# Patient Record
Sex: Female | Born: 1990 | Race: White | Hispanic: No | Marital: Single | Smoking: Current every day smoker
Health system: Southern US, Community
[De-identification: ages and names within clinical notes are randomized; demographics above are authoritative.]

## PROBLEM LIST (undated history)

## (undated) DIAGNOSIS — F329 Major depressive disorder, single episode, unspecified: Secondary | ICD-10-CM

## (undated) DIAGNOSIS — A749 Chlamydial infection, unspecified: Secondary | ICD-10-CM

## (undated) DIAGNOSIS — I639 Cerebral infarction, unspecified: Secondary | ICD-10-CM

## (undated) DIAGNOSIS — G43909 Migraine, unspecified, not intractable, without status migrainosus: Secondary | ICD-10-CM

## (undated) DIAGNOSIS — Z8619 Personal history of other infectious and parasitic diseases: Secondary | ICD-10-CM

## (undated) DIAGNOSIS — B1920 Unspecified viral hepatitis C without hepatic coma: Secondary | ICD-10-CM

## (undated) DIAGNOSIS — M1990 Unspecified osteoarthritis, unspecified site: Secondary | ICD-10-CM

## (undated) DIAGNOSIS — J45909 Unspecified asthma, uncomplicated: Secondary | ICD-10-CM

## (undated) DIAGNOSIS — R569 Unspecified convulsions: Secondary | ICD-10-CM

## (undated) DIAGNOSIS — F32A Depression, unspecified: Secondary | ICD-10-CM

## (undated) DIAGNOSIS — F419 Anxiety disorder, unspecified: Secondary | ICD-10-CM

## (undated) DIAGNOSIS — F1920 Other psychoactive substance dependence, uncomplicated: Secondary | ICD-10-CM

## (undated) DIAGNOSIS — E119 Type 2 diabetes mellitus without complications: Secondary | ICD-10-CM

## (undated) DIAGNOSIS — K219 Gastro-esophageal reflux disease without esophagitis: Secondary | ICD-10-CM

## (undated) HISTORY — DX: Migraine, unspecified, not intractable, without status migrainosus: G43.909

## (undated) HISTORY — PX: NOSE SURGERY: SHX723

## (undated) HISTORY — DX: Type 2 diabetes mellitus without complications: E11.9

## (undated) HISTORY — DX: Personal history of other infectious and parasitic diseases: Z86.19

## (undated) HISTORY — PX: NO PAST SURGERIES: SHX2092

## (undated) HISTORY — DX: Gastro-esophageal reflux disease without esophagitis: K21.9

## (undated) HISTORY — DX: Unspecified osteoarthritis, unspecified site: M19.90

## (undated) HISTORY — DX: Unspecified viral hepatitis C without hepatic coma: B19.20

## (undated) HISTORY — DX: Depression, unspecified: F32.A

## (undated) HISTORY — DX: Anxiety disorder, unspecified: F41.9

## (undated) HISTORY — DX: Unspecified convulsions: R56.9

## (undated) HISTORY — DX: Unspecified asthma, uncomplicated: J45.909

## (undated) HISTORY — DX: Cerebral infarction, unspecified: I63.9

## (undated) HISTORY — DX: Other psychoactive substance dependence, uncomplicated: F19.20

---
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
## 2010-01-23 ENCOUNTER — Encounter: Payer: Self-pay | Admitting: Family Medicine

## 2010-05-17 ENCOUNTER — Emergency Department (HOSPITAL_COMMUNITY)
Admission: EM | Admit: 2010-05-17 | Discharge: 2010-05-17 | Payer: Self-pay | Source: Home / Self Care | Admitting: Emergency Medicine

## 2010-06-27 NOTE — Consult Note (Signed)
Summary: [facility] Pt Discharge Instructions  [facility] Pt Discharge Instructions   Imported By: Clydell Hakim 09/06/2009 10:01:38  _____________________________________________________________________  External Attachment:    Type:   Image     Comment:   External Document

## 2010-06-27 NOTE — Miscellaneous (Signed)
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

## 2010-06-27 NOTE — Progress Notes (Signed)
Summary: Test Results   Phone Note Outgoing Call   Call placed by: Garen Grams LPN,  June 07, 2009 10:21 AM Summary of Call: Left message for patient to call back regarding test results. Initial call taken by: Garen Grams LPN,  June 07, 2009 10:21 AM  Follow-up for Phone Call        Spoke with patient and informed her of (+) chlamydia result. States she will be in today for treatment. Follow-up by: Garen Grams LPN,  June 07, 2009 10:28 AM

## 2010-06-27 NOTE — Assessment & Plan Note (Signed)
Summary: std?,df   Vital Signs:  Patient profile:   20 year old female Weight:      107 pounds Temp:     98.2 degrees F Pulse rate:   69 / minute BP sitting:   106 / 68  Vitals Entered By: Jone Baseman CMA (June 06, 2009 3:22 PM) CC: upreg and std screen Is Patient Diabetic? No Pain Assessment Patient in pain? no        Primary Care Provider:  Alanda Amass MD  CC:  upreg and std screen.  History of Present Illness: 1. STD and upreg Has had unprotected sex with BF. 2 days late on period (which are very regular). Has had some vaginal discharge and occasional sharp pain in her "uterus." BF volunteers that he has had some pain at the head of his penis but no d/c.  2. contraception Not on birth control. Tried depot in the past and didn't like it. Can't remember to take OCPs. Is a smoker ( ~1/3 PPD). Would like to try implanon.   ROS: no fevers, chills, flu-like symptoms.    Habits & Providers  Alcohol-Tobacco-Diet     Tobacco Status: current     Tobacco Counseling: to quit use of tobacco products     Cigarette Packs/Day: 0.25  Current Medications (verified): 1)  None  Allergies: No Known Drug Allergies  Social History: No history of sexual abuse per patient. High risk sexual behavior. 7 partners in only 2.5 years. Now with 61 yo boyfriend x 1.5 years. Mom knows this per patient. Lives with mom, mom's boyfriend, 43 year old ___.  Smokes about one pack of cigarretes every 2 weeks. Mom also smokes. Has used marijuana in the past denies cocaine. Packs/Day:  0.25  Physical Exam  General:  Well-developed,well-nourished,in no acute distress; alert,appropriate and cooperative throughout examination. VSS  Lungs:  work of breathing unlabored, clear to auscultation bilaterally; no wheezes, rales, or ronchi; good air movement throughout  Heart:  regular rate and rhythm, no murmurs; normal s1/s2  Abdomen:  +BS, soft, non-tender, non-distended; no masses; no rebound or  guarding  Genitalia:  Pelvic Exam:        External: normal female genitalia without lesions or masses        Vagina: normal without lesions or masses        Cervix: normal without lesions or masses, mild white discharge at the os        Adnexa: normal bimanual exam without masses or fullness        Uterus: normal by palpation        Pap smear: not performed Extremities:  no cyanosis, clubbing, or edema  Skin:  warm, good turgor; no rashes or lesions.   Impression & Recommendations:  Problem # 1:  SEXUALLY TRANSMITTED DISEASE, EXPOSURE TO (ICD-V01.6) Assessment Unchanged discussed safe sex. Advised condoms. STD labs today. Upreg negative. wet prep shows BV - flagyl for one week. Orders: GC/Chlamydia-[facility] ([id number]/[id number]) HIV-[facility] ([id number]) RPR-[facility] [phone]) Wet Prep- [facility] ([id number]) [facility]- Est Level  3 (13086)  Problem # 2:  CONTRACEPTIVE MANAGEMENT (ICD-V25.09) Assessment: Unchanged would like implanon - will refer for this procedure.  Orders: [facility]- Est Level  3 (99213) Misc. Referral (Misc. Ref)  Complete Medication List: 1)  Metronidazole 500 Mg Tabs (Metronidazole) .... One tab by mouth two times a day for 7 days.  Other Orders: U Preg-[facility] ([id number])  Patient Instructions: 1)  always use condoms with sex. 2)  we'll try to get you set up for  the implanon -- it's safer not to smoke if you are using hormonal birth control (increased risk of blood clots with smoking). 3)  follow-up with Dr. Wallene Huh in 3-4 months Prescriptions: METRONIDAZOLE 500 MG TABS (METRONIDAZOLE) one tab by mouth two times a day for 7 days.  #14 x 0   Entered and Authorized by:   Myrtie Soman  MD   Signed by:   Myrtie Soman  MD on 06/06/2009   Method used:   Electronically to        [facility]  [address] [phone]* (retail)       [address]       [place], [place]  [address]       Ph: [phone]       Fax: [phone]   RxID:   [id number]   Laboratory Results   Urine  Tests  Date/Time Received: June 06, 2009 3:22 PM  Date/Time Reported: June 06, 2009 3:26 PM     Urine HCG: negative Comments: ...........test performed by...........Marland KitchenTerese Door, CMA  Date/Time Received: June 06, 2009 3:50 PM  Date/Time Reported: June 06, 2009 4:02 PM   Wet [place] Source: vag WBC/hpf: >20 Bacteria/hpf: 3+  Cocci Clue cells/hpf: many  Positive whiff Yeast/hpf: none Trichomonas/hpf: none Comments: ...............test performed by......Marland KitchenBonnie A. [place], MLS (ASCP)cm

## 2010-06-27 NOTE — Assessment & Plan Note (Signed)
Summary: std treatment/ab   Nurse Visit   Allergies: No Known Drug Allergies  Medication Administration  Injection # 1:    Diagnosis: CHLAMYDIAL INFECTION (ICD-099.41)  Medication # 1:    Medication: Azithromycin oral    Diagnosis: CHLAMYDIAL INFECTION (ICD-099.41)    Dose: 1gram    Route: po    Exp Date: 04/25/2010    Lot #: [id number]    Mfr: greenstone    Comments: advised to abstain from sexual contact for 7 days    Patient tolerated medication without complications    Given by: Gladstone Pih (June 07, 2009 4:09 PM)  Orders Added: 1)  Azithromycin oral [Q0144] 2)  Est Level 1- [facility] [11914]
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
## 2010-08-05 LAB — URINALYSIS, ROUTINE W REFLEX MICROSCOPIC
Glucose, UA: NEGATIVE mg/dL
Hgb urine dipstick: NEGATIVE
Nitrite: NEGATIVE
pH: 5.5 (ref 5.0–8.0)

## 2010-08-05 LAB — PREGNANCY, URINE: Preg Test, Ur: NEGATIVE

## 2010-11-06 ENCOUNTER — Inpatient Hospital Stay (HOSPITAL_COMMUNITY)
Admission: AD | Admit: 2010-11-06 | Discharge: 2010-11-06 | Disposition: A | Discharge status: Home / Self Care | Payer: Self-pay | Source: Ambulatory Visit | Attending: Obstetrics & Gynecology | Admitting: Obstetrics & Gynecology

## 2010-11-06 DIAGNOSIS — N390 Urinary tract infection, site not specified: Secondary | ICD-10-CM

## 2010-11-06 DIAGNOSIS — R300 Dysuria: Secondary | ICD-10-CM | POA: Insufficient documentation

## 2010-11-06 LAB — POCT PREGNANCY, URINE: Preg Test, Ur: NEGATIVE

## 2010-11-06 LAB — URINALYSIS, ROUTINE W REFLEX MICROSCOPIC
Nitrite: NEGATIVE
Protein, ur: 100 mg/dL — AB
Specific Gravity, Urine: >1.03 — ABNORMAL HIGH (ref 1.005–1.030)

## 2010-11-06 LAB — URINE MICROSCOPIC-ADD ON

## 2011-02-20 ENCOUNTER — Encounter: Payer: Self-pay | Admitting: Family Medicine

## 2011-02-20 ENCOUNTER — Ambulatory Visit (INDEPENDENT_AMBULATORY_CARE_PROVIDER_SITE_OTHER): Payer: Medicaid Other | Admitting: Family Medicine

## 2011-02-20 DIAGNOSIS — J45909 Unspecified asthma, uncomplicated: Secondary | ICD-10-CM

## 2011-02-20 DIAGNOSIS — C321 Malignant neoplasm of supraglottis: Secondary | ICD-10-CM

## 2011-02-20 DIAGNOSIS — Z00129 Encounter for routine child health examination without abnormal findings: Secondary | ICD-10-CM

## 2011-02-20 DIAGNOSIS — Z0000 Encounter for general adult medical examination without abnormal findings: Secondary | ICD-10-CM

## 2011-02-20 DIAGNOSIS — N760 Acute vaginitis: Secondary | ICD-10-CM

## 2011-02-20 DIAGNOSIS — F17200 Nicotine dependence, unspecified, uncomplicated: Secondary | ICD-10-CM

## 2011-02-20 LAB — POCT WET PREP (WET MOUNT): WBC, Wet Prep HPF POC: >20

## 2011-02-20 MED ORDER — METRONIDAZOLE 500 MG PO TABS: 500.0000 mg | ORAL_TABLET | Freq: Three times a day (TID) | ORAL | Status: AC

## 2011-02-20 MED ORDER — EPINEPHRINE 0.3 MG/0.3ML IJ DEVI: 0.3000 mg | Freq: Once | INTRAMUSCULAR | Status: DC

## 2011-02-20 NOTE — Patient Instructions (Addendum)
It was good to meet you.  Congratulations on quitting smoking- this is one of the healthiest things you can do for yourself!    I will send you a letter if your lab results are normal or call you if they are abnormal.    For your hemorrhoids- try preparation H cream, and you can try Miralax powder for your constipation.    Please let me know if at any time you are interested in any other kind of birth control.

## 2011-02-21 LAB — GC/CHLAMYDIA PROBE AMP, GENITAL: GC Probe Amp, Genital: NEGATIVE

## 2011-02-21 NOTE — Progress Notes (Signed)
  Subjective:    Patient ID: Haley Olsen, female    DOB: Jun 14, 1990, 20 y.o.   MRN: [id number]  HPI  Patient presents for annual check up.  She has no complaints except that her wisdom teeth are impacted and she needs to have them taken out.  She says she does not have the money right now so she is trying to save up.  She also asks about her uvula- say she thinks it looks strange.    Pt is currently sexually active and was recently treated for both gonorrhea and trichomonas.  She says she uses condoms for birth control.  She does not like any kind of hormonal contraceptive because she says the hormones make her crazy.    Pt says she has an albuterol inhaler and has to use it sometimes.  She also recently quit smoking and is confident she can stay quit. She says she has an allergy to Wasp stings and is wondering how to get an epi pen.   Review of Systems Negative except HPI.     Objective:   Physical Exam BP 104/66  Pulse 60  Temp(Src) 98.2 F (36.8 C) (Oral)  Wt 112 lb 4.8 oz (50.939 kg)  LMP 01/12/2011 General appearance: alert, cooperative and no distress Eyes: conjunctivae/corneas clear. PERRL, EOM's intact. Fundi benign. Ears: normal TM's and external ear canals both ears Nose: Nares normal. Septum midline. Mucosa normal. No drainage or sinus tenderness. Throat: lips, mucosa, and tongue normal; teeth and gums normal and Patient does have long thin uvula but no erythema or lesions. Normal variant. Neck: no adenopathy, supple, symmetrical, trachea midline and thyroid not enlarged, symmetric, no tenderness/mass/nodules Lungs: clear to auscultation bilaterally Heart: regular rate and rhythm, S1, S2 normal, no murmur, click, rub or gallop Abdomen: soft, non-tender; bowel sounds normal; no masses,  no organomegaly Pelvic: cervix normal in appearance, external genitalia normal, no adnexal masses or tenderness, no cervical motion tenderness, rectovaginal septum normal, uterus normal  size, shape, and consistency and + grey, odorous discharge  Extremities: extremities normal, atraumatic, no cyanosis or edema      Assessment & Plan:  20 year old female, well woman exam - STD screening done, wet prep + for BV, Rx flagyl today.   - Rx for epi pen for wasp allergy ASTHMA, INTERMITTENT Well controlled, continue albuterol prn.   TOBACCO USER Patient has quit.  Congratulated and encouraged her.

## 2011-02-21 NOTE — Assessment & Plan Note (Signed)
Patient has quit.  Congratulated and encouraged her.

## 2011-02-21 NOTE — Assessment & Plan Note (Signed)
Well-controlled, continue albuterol prn

## 2011-02-24 LAB — URINE MICROSCOPIC-ADD ON

## 2011-02-24 LAB — URINALYSIS, ROUTINE W REFLEX MICROSCOPIC
Bilirubin Urine: NEGATIVE
Ketones, ur: NEGATIVE
Leukocytes, UA: NEGATIVE
Urobilinogen, UA: 0.2
pH: 6

## 2011-02-24 LAB — WET PREP, GENITAL: Yeast Wet Prep HPF POC: NONE SEEN

## 2011-02-24 LAB — CBC
MCHC: 33.4
MCV: 96.3
Platelets: 260
RBC: 4.43
RDW: 12.5

## 2011-02-24 LAB — GC/CHLAMYDIA PROBE AMP, GENITAL: Chlamydia, DNA Probe: POSITIVE — AB

## 2011-02-25 ENCOUNTER — Encounter: Payer: Self-pay | Admitting: Family Medicine

## 2011-03-06 ENCOUNTER — Emergency Department (HOSPITAL_COMMUNITY)
Admission: EM | Admit: 2011-03-06 | Discharge: 2011-03-06 | Disposition: A | Discharge status: Home / Self Care | Payer: Medicaid Other | Attending: Emergency Medicine | Admitting: Emergency Medicine

## 2011-03-06 ENCOUNTER — Emergency Department (HOSPITAL_COMMUNITY): Payer: Medicaid Other

## 2011-03-06 DIAGNOSIS — M25539 Pain in unspecified wrist: Secondary | ICD-10-CM | POA: Insufficient documentation

## 2011-03-06 DIAGNOSIS — J45909 Unspecified asthma, uncomplicated: Secondary | ICD-10-CM | POA: Insufficient documentation

## 2011-03-06 DIAGNOSIS — W19XXXA Unspecified fall, initial encounter: Secondary | ICD-10-CM | POA: Insufficient documentation

## 2011-03-06 DIAGNOSIS — Y990 Civilian activity done for income or pay: Secondary | ICD-10-CM | POA: Insufficient documentation

## 2011-03-06 DIAGNOSIS — S62109A Fracture of unspecified carpal bone, unspecified wrist, initial encounter for closed fracture: Secondary | ICD-10-CM | POA: Insufficient documentation

## 2011-04-25 ENCOUNTER — Emergency Department (HOSPITAL_COMMUNITY)
Admission: EM | Admit: 2011-04-25 | Discharge: 2011-04-25 | Discharge status: Against Medical Advice | Payer: Medicaid Other | Attending: Emergency Medicine | Admitting: Emergency Medicine

## 2011-04-25 ENCOUNTER — Encounter (HOSPITAL_COMMUNITY): Payer: Self-pay | Admitting: Emergency Medicine

## 2011-04-25 DIAGNOSIS — R079 Chest pain, unspecified: Secondary | ICD-10-CM | POA: Insufficient documentation

## 2011-04-25 DIAGNOSIS — R141 Gas pain: Secondary | ICD-10-CM | POA: Insufficient documentation

## 2011-04-25 DIAGNOSIS — R109 Unspecified abdominal pain: Secondary | ICD-10-CM | POA: Insufficient documentation

## 2011-04-25 DIAGNOSIS — J45909 Unspecified asthma, uncomplicated: Secondary | ICD-10-CM | POA: Insufficient documentation

## 2011-04-25 DIAGNOSIS — R091 Pleurisy: Secondary | ICD-10-CM | POA: Insufficient documentation

## 2011-04-25 DIAGNOSIS — R142 Eructation: Secondary | ICD-10-CM | POA: Insufficient documentation

## 2011-04-25 MED ORDER — GI COCKTAIL ~~LOC~~: 30.0000 mL | Freq: Once | ORAL | Status: DC

## 2011-04-25 MED ORDER — KETOROLAC TROMETHAMINE 60 MG/2ML IM SOLN: 30.0000 mg | Freq: Once | INTRAMUSCULAR | Status: DC

## 2011-04-25 MED ORDER — DIAZEPAM 5 MG PO TABS: 5.0000 mg | ORAL_TABLET | Freq: Once | ORAL | Status: DC

## 2011-04-25 NOTE — ED Notes (Signed)
Pt c/o right sided rib pain starting last night; pt sts pain worse with deep breath and can not take deep breath; pt denies injury or other illness

## 2011-04-25 NOTE — ED Provider Notes (Signed)
History     CSN: [id number] Arrival date & time: 04/25/2011 12:23 PM   First MD Initiated Contact with Patient 04/25/11 1257      Chief Complaint  Patient presents with  . Flank Pain    rib pain    (Consider location/radiation/quality/duration/timing/severity/associated sxs/prior treatment) HPI Comments: Patient reports that she's had a sharp pain in the right flank rib cage region which is worse with taking a deep breath. She has a history of asthma but denies any wheezing or chest tightness. She denies any injury. She denies any skin rash, fever or chills. She reports that she had some bloating and gas pains related to eating Thanksgiving dinner but that seems to resolve. She reports that she did have some mild pain in the same location earlier yesterday but it resolved. And then the pain began again last night and woke her from sleep it has been present since last night. She reports that she cannot take a deep breath because the pain is so severe. She reports that she has a bowel movement which did not improve her symptoms. She denies any nausea or vomiting. She denies any lower extremity swelling or pain and denies any recent travel.  Patient is a 20 y.o. female presenting with flank pain. The history is provided by the patient.  Flank Pain Associated symptoms include chest pain. Pertinent negatives include no shortness of breath.    Past Medical History  Diagnosis Date  . Asthma     History reviewed. No pertinent past surgical history.  Family History  Problem Relation Age of Onset  . Dementia Maternal Grandmother   . Heart disease Maternal Grandmother     History  Substance Use Topics  . Smoking status: Former Smoker    Quit date: 12/20/2010  . Smokeless tobacco: Not on file  . Alcohol Use: Yes    OB History    Grav Para Term Preterm Abortions TAB SAB Ect Mult Living                  Review of Systems  Constitutional: Negative.   Respiratory: Negative for  shortness of breath and wheezing.   Cardiovascular: Positive for chest pain. Negative for palpitations and leg swelling.  Genitourinary: Positive for flank pain. Negative for dysuria and urgency.  Musculoskeletal: Negative for back pain.  Skin: Negative for rash.    Allergies  Wasp venom protein and Hydrocodone  Home Medications   Current Outpatient Rx  Name Route Sig Dispense Refill  . ALBUTEROL SULFATE HFA 108 (90 BASE) MCG/ACT IN AERS Inhalation Inhale 2 puffs into the lungs every 6 (six) hours as needed. For shortness of breath    . IBUPROFEN 200 MG PO TABS Oral Take 200 mg by mouth every 6 (six) hours as needed. For pain       BP 108/55  Pulse 75  Temp(Src) 97.5 F (36.4 C) (Oral)  Resp 20  SpO2 99%  Physical Exam  Constitutional: She is oriented to person, place, and time.  Pulmonary/Chest: Breath sounds normal. No respiratory distress.         No coughing  Abdominal: There is no tenderness. There is negative Murphy's sign.  Neurological: She is alert and oriented to person, place, and time. Gait normal.  Skin: Skin is warm and dry. No rash noted.    ED Course  Procedures (including critical care time)  Labs Reviewed - No data to display No results found.   No diagnosis found.    MDM  After I saw her, I told her that we would order some medications to relax her symptoms as I felt she likely had some pleurisy.  My plan was to try valium, IM toradol and a GI cocktail as well.  RA sats were 99% and normal.    Pt was sitting in chair outside of her designated area and began to appear impatient.  She raised voice towards nurses and complained that "Am I just going to sit here and suffer or are you going to give me something?"  RN's tried to apologize and inform her that they would get her medications shortly, patient got upset and left.  She was able to ambulate on her own power and in no distress.          Gavin Pound. Caron Tardif, MD 04/25/11 1329

## 2011-04-25 NOTE — ED Notes (Signed)
Pt yelling and impatient stating "Am I gonna sit here for an hour or what?" Patient left stating, "I'm gonna discharge myself! I'm not getting charged for this visit!" Pt walked out of ED. MD ordered meds for patient at 1315 and patient left department at 1320. Dr. Oletta Lamas made aware.

## 2011-04-28 ENCOUNTER — Emergency Department (HOSPITAL_COMMUNITY)
Admission: EM | Admit: 2011-04-28 | Discharge: 2011-04-29 | Disposition: A | Discharge status: Home / Self Care | Payer: Medicaid Other

## 2011-04-30 ENCOUNTER — Ambulatory Visit: Payer: Self-pay | Admitting: Family Medicine

## 2011-05-01 ENCOUNTER — Encounter: Payer: Self-pay | Admitting: Family Medicine

## 2011-05-01 ENCOUNTER — Other Ambulatory Visit: Payer: Self-pay | Admitting: Family Medicine

## 2011-05-01 ENCOUNTER — Ambulatory Visit (INDEPENDENT_AMBULATORY_CARE_PROVIDER_SITE_OTHER): Payer: Medicaid Other | Admitting: Family Medicine

## 2011-05-01 VITALS — BP 112/71 | HR 104 | Temp 97.7°F | Ht 65.0 in | Wt 106.0 lb

## 2011-05-01 DIAGNOSIS — N760 Acute vaginitis: Secondary | ICD-10-CM

## 2011-05-01 DIAGNOSIS — A499 Bacterial infection, unspecified: Secondary | ICD-10-CM

## 2011-05-01 DIAGNOSIS — B9689 Other specified bacterial agents as the cause of diseases classified elsewhere: Secondary | ICD-10-CM | POA: Insufficient documentation

## 2011-05-01 DIAGNOSIS — M549 Dorsalgia, unspecified: Secondary | ICD-10-CM

## 2011-05-01 DIAGNOSIS — F17200 Nicotine dependence, unspecified, uncomplicated: Secondary | ICD-10-CM

## 2011-05-01 DIAGNOSIS — N912 Amenorrhea, unspecified: Secondary | ICD-10-CM

## 2011-05-01 LAB — POCT URINE PREGNANCY: Preg Test, Ur: NEGATIVE

## 2011-05-01 LAB — POCT WET PREP (WET MOUNT)

## 2011-05-01 MED ORDER — METRONIDAZOLE 500 MG PO TABS: 500.0000 mg | ORAL_TABLET | Freq: Two times a day (BID) | ORAL | Status: AC

## 2011-05-01 NOTE — Assessment & Plan Note (Addendum)
December 2012 would prep showed likely bacterial vaginosis with positive clue cells patient's called and will be given Flagyl for the next 5 days. GC and chlamydia is still pending.

## 2011-05-01 NOTE — Progress Notes (Signed)
  Subjective:    Patient ID: Haley Olsen, female    DOB: 15-Sep-1990, 20 y.o.   MRN: [id number]  HPI 20 year old female coming in for evaluation of back and stomach pain. Patient has been having a scraping back started approximately one week ago patient describes it as a shooting pain with mostly deep inspiration. Patient is concerned as his gallbladder after she has done some reading on line. Patient states that it does seem to hurt somewhat with movement as well mostly the right side right below her rib cage in the back. Patient denies any type of dysuria, hematuria, patient denies any type of lower trace swelling. Patient does state that she's had a little bit of some vaginal discharge and is sexually active with no protection with her monogamous boyfriend. Patient does not know she's pregnant urine pregnancy today is negative. For this pain patient has tried to take ibuprofen 800 mg twice a day with minimal improvement patient states the only a little better. Patient states that it might be associated with food such as greasy fatty foods but does state that she be that association after she did reading. Patient denies any type of fever, chills, nausea, vomiting has seen some lighter stools but that has stopped as well.   Review of Systems As stated in the history of present illness    Objective:   Physical Exam General: No apparent distress healthy white female Cardiovascular-regular rate and rhythm no murmur Pulmonary-clear to auscultation bilaterally Abdominal exam patient has bowel sounds in all 4 quadrants patient is minimally tender in the right lower quadrant no rebounding no involuntary guarding Back exam patient does have some mild rotation of L2 rotated and side bent right with tight psoas muscle. Patient has no CVA tenderness Pelvic exam: Injuries no redness or erythema vaginal vault shows no erythema no masses no discharge patient cervicis retroflexed patient does have significant  mucus from the os and some minimal erythema of the cervix. On bimanual exam patient is minimally tender in the right lower quadrant but no masses palpated.    Assessment & Plan:

## 2011-05-01 NOTE — Assessment & Plan Note (Addendum)
Likely still is spasm right-sided improved with HVLA on side continue to monitor differential includes painful ovulation also in differential includes appendicitis which is very low likelihood with the amount of time as well as gallbladder secondary to patient's age fitness level and patient negative Eulah Pont sign today.  Discussed proper stretching with patient and proper exercises to see if this helps improve. If this is related to her ovulation considered doing overall contraceptives but patient declined at this time.

## 2011-05-01 NOTE — Patient Instructions (Signed)
I do not think this is her gallbladder. I think this is more musculoskeletal in nature. I actually think you have tight hip flexors. And I when she to start doing some of these stretches especially after you bike. I think if you still have problems with your bowel movements please come back and we can try to consider other things as well. But as of right now with you tell me that they have become normal I don't think we need to do any further tests. Hip Exercises RANGE OF MOTION (ROM) AND STRETCHING EXERCISES  These exercises may help you when beginning to rehabilitate your injury. Doing them too aggressively can worsen your condition. Complete them slowly and gently. Your symptoms may resolve with or without further involvement from your physician, physical therapist or athletic trainer. While completing these exercises, remember:   Restoring tissue flexibility helps normal motion to return to the joints. This allows healthier, less painful movement and activity.   An effective stretch should be held for at least 30 seconds.   A stretch should never be painful. You should only feel a gentle lengthening or release in the stretched tissue. If these stretches worsen your symptoms even when done gently, consult your physician, physical therapist or athletic trainer.  STRETCH - Hamstrings, Supine   Lie on your back. Loop a belt or towel over the ball of your right / left foot.   Straighten your right / left knee and slowly pull on the belt to raise your leg. Do not allow the right / left knee to bend. Keep your opposite leg flat on the floor.   Raise the leg until you feel a gentle stretch behind your right / left knee or thigh. Hold this position for __________ seconds.  Repeat __________ times. Complete this stretch __________ times per day.  STRETCH - Hip Rotators   Lie on your back on a firm surface. Grasp your right / left knee with your right / left hand and your ankle with your opposite  hand.   Keeping your hips and shoulders firmly planted, gently pull your right / left knee and rotate your lower leg toward your opposite shoulder until you feel a stretch in your buttocks.   Hold this stretch for __________ seconds.  Repeat this stretch __________ times. Complete this stretch __________ times per day. STRETCH - Hamstrings/Adductors, V-Sit   Sit on the floor with your legs extended in a large "V," keeping your knees straight.   With your head and chest upright, bend at your waist reaching for your right foot to stretch your left adductors.   You should feel a stretch in your left inner thigh. Hold for __________ seconds.   Return to the upright position to relax your leg muscles.   Continuing to keep your chest upright, bend straight forward at your waist to stretch your hamstrings.   You should feel a stretch behind both of your thighs and/or knees. Hold for __________ seconds.   Return to the upright position to relax your leg muscles.   Repeat steps 2 through 4 for opposite leg.  Repeat __________ times. Complete this exercise __________ times per day.  STRETCHING - Hip Flexors, Lunge  Half kneel with your right / left knee on the floor and your opposite knee bent and directly over your ankle.   Keep good posture with your head over your shoulders. Tighten your buttocks to point your tailbone downward; this will prevent your back from arching too much.  You should feel a gentle stretch in the front of your thigh and/or hip. If you do not feel any resistance, slightly slide your opposite foot forward and then slowly lunge forward so your knee once again lines up over your ankle. Be sure your tailbone remains pointed downward.   Hold this stretch for __________ seconds.  Repeat __________ times. Complete this stretch __________ times per day. STRENGTHENING EXERCISES These exercises may help you when beginning to rehabilitate your injury. They may resolve your  symptoms with or without further involvement from your physician, physical therapist or athletic trainer. While completing these exercises, remember:   Muscles can gain both the endurance and the strength needed for everyday activities through controlled exercises.   Complete these exercises as instructed by your physician, physical therapist or athletic trainer. Progress the resistance and repetitions only as guided.   You may experience muscle soreness or fatigue, but the pain or discomfort you are trying to eliminate should never worsen during these exercises. If this pain does worsen, stop and make certain you are following the directions exactly. If the pain is still present after adjustments, discontinue the exercise until you can discuss the trouble with your clinician.  STRENGTH - Hip Extensors, Bridge   Lie on your back on a firm surface. Bend your knees and place your feet flat on the floor.   Tighten your buttocks muscles and lift your bottom off the floor until your trunk is level with your thighs. You should feel the muscles in your buttocks and back of your thighs working. If you do not feel these muscles, slide your feet 1-2 inches further away from your buttocks.   Hold this position for __________ seconds.   Slowly lower your hips to the starting position and allow your buttock muscles relax completely before beginning the next repetition.   If this exercise is too easy, you may cross your arms over your chest.  Repeat __________ times. Complete this exercise __________ times per day.  STRENGTH - Hip Abductors, Straight Leg Raises  Be aware of your form throughout the entire exercise so that you exercise the correct muscles. Sloppy form means that you are not strengthening the correct muscles.  Lie on your side so that your head, shoulders, knee and hip line up. You may bend your lower knee to help maintain your balance. Your right / left leg should be on top.   Roll your  hips slightly forward, so that your hips are stacked directly over each other and your right / left knee is facing forward.   Lift your top leg up 4-6 inches, leading with your heel. Be sure that your foot does not drift forward or that your knee does not roll toward the ceiling.   Hold this position for __________ seconds. You should feel the muscles in your outer hip lifting (you may not notice this until your leg begins to tire).   Slowly lower your leg to the starting position. Allow the muscles to fully relax before beginning the next repetition.  Repeat __________ times. Complete this exercise __________ times per day.  STRENGTH - Hip Adductors, Straight Leg Raises   Lie on your side so that your head, shoulders, knee and hip line up. You may place your upper foot in front to help maintain your balance. Your right / left leg should be on the bottom.   Roll your hips slightly forward, so that your hips are stacked directly over each other and your right /  left knee is facing forward.   Tense the muscles in your inner thigh and lift your bottom leg 4-6 inches. Hold this position for __________ seconds.   Slowly lower your leg to the starting position. Allow the muscles to fully relax before beginning the next repetition.  Repeat __________ times. Complete this exercise __________ times per day.  STRENGTH - Quadriceps, Straight Leg Raises  Quality counts! Watch for signs that the quadriceps muscle is working to insure you are strengthening the correct muscles and not "cheating" by substituting with healthier muscles.  Lay on your back with your right / left leg extended and your opposite knee bent.   Tense the muscles in the front of your right / left thigh. You should see either your knee cap slide up or increased dimpling just above the knee. Your thigh may even quiver.   Tighten these muscles even more and raise your leg 4 to 6 inches off the floor. Hold for right / left seconds.    Keeping these muscles tense, lower your leg.   Relax the muscles slowly and completely in between each repetition.  Repeat __________ times. Complete this exercise __________ times per day.  STRENGTH - Hip Abductors, Standing  Tie one end of a rubber exercise band/tubing to a secure surface (table, pole) and tie a loop at the other end.   Place the loop around your right / left ankle. Keeping your ankle with the band directly opposite of the secured end, step away until there is tension in the tube/band.   Hold onto a chair as needed for balance.   Keeping your back upright, your shoulders over your hips, and your toes pointing forward, lift your right / left leg out to your side. Be sure to lift your leg with your hip muscles. Do not "throw" your leg or tip your body to lift your leg.   Slowly and with control, return to the starting position.  Repeat exercise __________ times. Complete this exercise __________ times per day.  STRENGTH - Quadriceps, Squats  Stand in a door frame so that your feet and knees are in line with the frame.   Use your hands for balance, not support, on the frame.   Slowly lower your weight, bending at the hips and knees. Keep your lower legs upright so that they are parallel with the door frame. Squat only within the range that does not increase your knee pain. Never let your hips drop below your knees.   Slowly return upright, pushing with your legs, not pulling with your hands.  Document Released: 05/30/2005 Document Revised: 01/22/2011 Document Reviewed: 08/24/2008 [facility] Patient Information 2012 [place], [place].

## 2011-05-01 NOTE — Assessment & Plan Note (Signed)
Patient quit in 2012 we'll continue to encourage her to stay quitting will leave problem problem list for now if the past 6 months we'll take off at that time.

## 2011-05-05 ENCOUNTER — Ambulatory Visit: Payer: Self-pay | Admitting: *Deleted

## 2011-05-05 ENCOUNTER — Telehealth: Payer: Self-pay | Admitting: Family Medicine

## 2011-05-05 DIAGNOSIS — A749 Chlamydial infection, unspecified: Secondary | ICD-10-CM

## 2011-05-05 MED ORDER — AZITHROMYCIN 1 G PO PACK
1.0000 g | PACK | Freq: Once | ORAL | Status: AC
  Administered 2011-05-05: 1 g via ORAL

## 2011-05-05 MED ORDER — AZITHROMYCIN 1 G PO PACK: 1.0000 | PACK | Freq: Once | ORAL | Status: AC

## 2011-05-05 NOTE — Telephone Encounter (Signed)
Discussed results of positive chlamydia with pt, she will call and get nurse visit for today.  Put in order for 1 gram azithro slurry.  Discuss the need for partner to be treated as well.

## 2011-05-05 NOTE — Telephone Encounter (Signed)
Pt came in for std treatment & vomitted about 45 mins later, wants to know what she should do?

## 2011-05-05 NOTE — Progress Notes (Addendum)
Patient in for STD treatment. Advised to abstain from sex for 7 days.  Advise partner to be treated. Always use condoms to prevent STD.  Communicable Disease report faxed to [facility].

## 2011-05-05 NOTE — Telephone Encounter (Signed)
Spoke with Dr. Deirdre Priest who advises pt come back in for a second treatment.   Pt informed and appt made for Wednesday.  Will forward to MD as well as Larita Fife for [place].......................... Marland KitchenFleeger, Maryjo Rochester

## 2011-05-07 ENCOUNTER — Ambulatory Visit: Payer: Self-pay | Admitting: *Deleted

## 2011-05-07 DIAGNOSIS — A749 Chlamydial infection, unspecified: Secondary | ICD-10-CM

## 2011-05-07 MED ORDER — AZITHROMYCIN 1 G PO PACK
1.0000 g | PACK | Freq: Once | ORAL | Status: AC
  Administered 2011-05-07: 1 g via ORAL

## 2011-05-07 NOTE — Progress Notes (Signed)
Patient came in for repeat dose of azithromycin because she vomited 45 minutes after dose she received on 12/10.

## 2011-07-14 ENCOUNTER — Encounter (HOSPITAL_COMMUNITY): Payer: Self-pay | Admitting: *Deleted

## 2011-07-14 ENCOUNTER — Inpatient Hospital Stay (HOSPITAL_COMMUNITY)
Admission: AD | Admit: 2011-07-14 | Discharge: 2011-07-14 | Disposition: A | Discharge status: Home / Self Care | Payer: Medicaid Other | Source: Ambulatory Visit | Attending: Family Medicine | Admitting: Family Medicine

## 2011-07-14 DIAGNOSIS — Z711 Person with feared health complaint in whom no diagnosis is made: Secondary | ICD-10-CM | POA: Insufficient documentation

## 2011-07-14 DIAGNOSIS — N898 Other specified noninflammatory disorders of vagina: Secondary | ICD-10-CM

## 2011-07-14 DIAGNOSIS — N939 Abnormal uterine and vaginal bleeding, unspecified: Secondary | ICD-10-CM

## 2011-07-14 HISTORY — DX: Chlamydial infection, unspecified: A74.9

## 2011-07-14 LAB — URINALYSIS, ROUTINE W REFLEX MICROSCOPIC
Glucose, UA: NEGATIVE mg/dL
Leukocytes, UA: NEGATIVE
Protein, ur: NEGATIVE mg/dL
Specific Gravity, Urine: 1.015 (ref 1.005–1.030)
pH: 7.5 (ref 5.0–8.0)

## 2011-07-14 LAB — CBC
Hemoglobin: 13.1 g/dL (ref 12.0–15.0)
MCHC: 33.4 g/dL (ref 30.0–36.0)
Platelets: 239 10*3/uL (ref 150–400)
RBC: 4.15 MIL/uL (ref 3.87–5.11)

## 2011-07-14 LAB — URINE MICROSCOPIC-ADD ON

## 2011-07-14 LAB — HCG, QUANTITATIVE, PREGNANCY: hCG, Beta Chain, Quant, S: <1 m[IU]/mL (ref <5)

## 2011-07-14 NOTE — Progress Notes (Signed)
Pt states she had +UPT last week. Started spotting last night and heavier today changing a pad every hour, bright red blood with lower abdominal cramping.

## 2011-07-14 NOTE — ED Provider Notes (Signed)
History     Chief Complaint  Patient presents with  . Vaginal Bleeding   HPI  Pt states she had +UPT last week. Started spotting last night and heavier today changing a pad every hour, bright red blood with lower abdominal cramping.  Cycles are usually regular.  LNMP 05/26/11.     Past Medical History  Diagnosis Date  . Asthma   . Chlamydia     Past Surgical History  Procedure Date  . No past surgeries     Family History  Problem Relation Age of Onset  . Dementia Maternal Grandmother   . Heart disease Maternal Grandmother     History  Substance Use Topics  . Smoking status: Former Smoker    Quit date: 12/20/2010  . Smokeless tobacco: Not on file  . Alcohol Use: Yes    Allergies:  Allergies  Allergen Reactions  . Wasp Venom Protein   . Hydrocodone Hives    Prescriptions prior to admission  Medication Sig Dispense Refill  . albuterol (PROVENTIL HFA;VENTOLIN HFA) 108 (90 BASE) MCG/ACT inhaler Inhale 2 puffs into the lungs every 6 (six) hours as needed. Shortness of breath        Review of Systems  Gastrointestinal: Positive for abdominal pain.  Genitourinary:       Vaginal bleeding  All other systems reviewed and are negative.   Physical Exam   Blood pressure 113/61, pulse 75, temperature 98.2 F (36.8 C), temperature source Oral, resp. rate 20, height 5\' 4"  (1.626 m), weight 49.045 kg (108 lb 2 oz), last menstrual period 05/26/2011.  Physical Exam  Constitutional: She is oriented to person, place, and time. She appears well-developed and well-nourished.  HENT:  Head: Normocephalic.  Neck: Normal range of motion. Neck supple.  Cardiovascular: Normal rate, regular rhythm and normal heart sounds.   Respiratory: Effort normal and breath sounds normal.  GI: Soft. She exhibits no mass. There is tenderness. There is no guarding.  Genitourinary: Uterus normal. Uterus is not enlarged. Right adnexum displays no mass. Left adnexum displays no mass. There is  bleeding (negative clots) around the vagina.  Neurological: She is alert and oriented to person, place, and time. She has normal reflexes.  Skin: Skin is warm and dry.    MAU Course  Procedures  Results for orders placed during the hospital encounter of 07/14/11 (from the past 24 hour(s))  URINALYSIS, ROUTINE W REFLEX MICROSCOPIC     Status: Abnormal   Collection Time   07/14/11  6:00 PM      Component Value Range   Color, Urine YELLOW  YELLOW    APPearance CLEAR  CLEAR    Specific Gravity, Urine 1.015  1.005 - 1.030    pH 7.5  5.0 - 8.0    Glucose, UA NEGATIVE  NEGATIVE (mg/dL)   Hgb urine dipstick LARGE (*) NEGATIVE    Bilirubin Urine NEGATIVE  NEGATIVE    Ketones, ur NEGATIVE  NEGATIVE (mg/dL)   Protein, ur NEGATIVE  NEGATIVE (mg/dL)   Urobilinogen, UA 0.2  0.0 - 1.0 (mg/dL)   Nitrite NEGATIVE  NEGATIVE    Leukocytes, UA NEGATIVE  NEGATIVE   URINE MICROSCOPIC-ADD ON     Status: Abnormal   Collection Time   07/14/11  6:00 PM      Component Value Range   Squamous Epithelial / LPF RARE  RARE    WBC, UA 0-2  <3 (WBC/hpf)   RBC / HPF 21-50  <3 (RBC/hpf)   Bacteria, UA FEW (*)  RARE   POCT PREGNANCY, URINE     Status: Normal   Collection Time   07/14/11  6:25 PM      Component Value Range   Preg Test, Ur NEGATIVE  NEGATIVE   CBC     Status: Normal   Collection Time   07/14/11  6:55 PM      Component Value Range   WBC 7.8  4.0 - 10.5 (K/uL)   RBC 4.15  3.87 - 5.11 (MIL/uL)   Hemoglobin 13.1  12.0 - 15.0 (g/dL)   HCT 16.1  09.6 - 04.5 (%)   MCV 94.5  78.0 - 100.0 (fL)   MCH 31.6  26.0 - 34.0 (pg)   MCHC 33.4  30.0 - 36.0 (g/dL)   RDW 40.9  81.1 - 91.4 (%)   Platelets 239  150 - 400 (K/uL)  HCG, QUANTITATIVE, PREGNANCY     Status: Normal   Collection Time   07/14/11  6:55 PM      Component Value Range   hCG, Beta Chain, Quant, S <1  <5 (mIU/mL)     Assessment and Plan  Menstruation  Plan: DC to home Ibuprofen for pain  [facility] 07/14/2011, 6:45 PM

## 2011-07-15 ENCOUNTER — Ambulatory Visit: Payer: Medicaid Other

## 2011-07-15 NOTE — ED Provider Notes (Signed)
Chart reviewed and agree with management and plan.  

## 2011-09-09 ENCOUNTER — Emergency Department (HOSPITAL_COMMUNITY)
Admission: EM | Admit: 2011-09-09 | Discharge: 2011-09-09 | Discharge status: Absent without leave | Payer: Medicaid Other | Source: Home / Self Care

## 2012-11-12 ENCOUNTER — Ambulatory Visit (INDEPENDENT_AMBULATORY_CARE_PROVIDER_SITE_OTHER): Payer: Managed Care, Other (non HMO) | Admitting: Family Medicine

## 2012-11-12 ENCOUNTER — Encounter: Payer: Self-pay | Admitting: Family Medicine

## 2012-11-12 ENCOUNTER — Ambulatory Visit
Admission: RE | Admit: 2012-11-12 | Discharge: 2012-11-12 | Disposition: A | Discharge status: Home / Self Care | Payer: Managed Care, Other (non HMO) | Source: Ambulatory Visit | Attending: Family Medicine | Admitting: Family Medicine

## 2012-11-12 VITALS — BP 139/55 | HR 91 | Temp 98.5°F | Ht 64.0 in | Wt 106.0 lb

## 2012-11-12 DIAGNOSIS — M25579 Pain in unspecified ankle and joints of unspecified foot: Secondary | ICD-10-CM

## 2012-11-12 DIAGNOSIS — M25571 Pain in right ankle and joints of right foot: Secondary | ICD-10-CM

## 2012-11-12 MED ORDER — OXYCODONE-ACETAMINOPHEN 5-325 MG PO TABS: 1.0000 | ORAL_TABLET | Freq: Four times a day (QID) | ORAL | Status: DC | PRN

## 2012-11-12 NOTE — Assessment & Plan Note (Addendum)
Patient with recent injury to right big toe. Concern is for fracture of this toe vs jammed toe vs tendinous injury. Plan: 3 view foot XR to evaluate for fracture revealed soft tissue injury with no fracture. Given new pair of crutches and advised to limit weight bearing. Advised to use ice and elevation. Percocet 5-325 #30 given for pain. Continue supportive measures.

## 2012-11-12 NOTE — Progress Notes (Signed)
  Subjective:    Patient ID: Haley Olsen, female    DOB: 09-Jan-1991, 22 y.o.   MRN: [id number]  HPI Patient is a 22 yo female who presents with right big toe and foot pain.  States 2 days ago was at [employer] point and was doing a hand stand in the wave pool when she fell and her right big toe got caught. States was abducted. In the period after this occurred it became swollen and black and blue. Has pain when trying to bear weight. Has taken aleve with some improvement in pain if she is resting and elevating her leg at that same time. Rates pain 3/10 with this. Currently 9/10. States hurts most at metacarpophalangeal joint. Has been using borrowed pair of crutches.  PMH: former smoker, currently smokes marijuana  Review of Systems see HPI     Objective:   Physical Exam  Constitutional: She appears well-developed and well-nourished.  HENT:  Head: Normocephalic and atraumatic.  Musculoskeletal:  Right Foot: bruising over big toe mostly around interphalangeal joint and metacarpophalangeal joint, swelling in these areas as well. TTP in these areas as well. Limited ROM in these joints. Full ROM in the rest of her toes. Full ROM at ankle. 2+ DP and PT pulses. Unable to bear weight.  BP 139/55  Pulse 91  Temp(Src) 98.5 F (36.9 C) (Oral)  Ht 5\' 4"  (1.626 m)  Wt 106 lb (48.081 kg)  BMI 18.19 kg/m2    Assessment & Plan:

## 2012-11-12 NOTE — Patient Instructions (Addendum)
Nice to meet you today. We will send you for X-ray of your foot to determine if it is broken. Please continue to use the crutches. You can ice your foot and rest it with elevation. I have given you a prescription for percocet for your pain.  We will call you with the results of your X-ray.

## 2012-11-16 ENCOUNTER — Telehealth: Payer: Self-pay | Admitting: *Deleted

## 2012-11-16 ENCOUNTER — Telehealth: Payer: Self-pay | Admitting: Family Medicine

## 2012-11-16 NOTE — Telephone Encounter (Signed)
LMOVM for pt to return call .Legion Discher Dawn  

## 2012-11-16 NOTE — Telephone Encounter (Signed)
Message copied by Osborne Oman on Tue Nov 16, 2012 10:11 AM ------      Message from: Birdie Sons, ERIC G      Created: Mon Nov 15, 2012  6:48 PM       Patient with no fracture on foot film. Please advise to continue rest, ice, elevation, and pain medication as needed. If does not get better should return to care. Thanks. ------

## 2012-11-16 NOTE — Telephone Encounter (Signed)
Will fwd to Md.  Jaimon Bugaj L, CMA  

## 2012-11-16 NOTE — Telephone Encounter (Signed)
Patient is returning call. Patient also would like to know how she can get refills on her medication of OxyCodone? JW

## 2012-11-17 NOTE — Telephone Encounter (Signed)
Patient seen 11/12/12 and given Rx for oxycodone #30 to take one pill q6 hr prn for pain. Should have been 7 days worth of medication. If she has used all of her prescription, she would need to be seen again to evaluate her pain and the healing process of her foot as she has taken more than prescribed of her medication.

## 2012-11-17 NOTE — Telephone Encounter (Signed)
Patient received a call back , but lost a signal and wants another call back concerning medication refill and results about her foot. JW

## 2012-11-17 NOTE — Telephone Encounter (Signed)
Pt told needs OV.  Pt verbalized understanding.  Dana Debo, Darlyne Russian, CMA

## 2014-03-27 ENCOUNTER — Encounter: Payer: Self-pay | Admitting: Family Medicine

## 2014-08-04 ENCOUNTER — Emergency Department (HOSPITAL_COMMUNITY)
Admission: EM | Admit: 2014-08-04 | Discharge: 2014-08-04 | Disposition: A | Discharge status: Home / Self Care | Payer: Medicaid Other | Attending: Emergency Medicine | Admitting: Emergency Medicine

## 2014-08-04 ENCOUNTER — Encounter (HOSPITAL_COMMUNITY): Payer: Self-pay | Admitting: *Deleted

## 2014-08-04 DIAGNOSIS — Z720 Tobacco use: Secondary | ICD-10-CM | POA: Insufficient documentation

## 2014-08-04 DIAGNOSIS — J45909 Unspecified asthma, uncomplicated: Secondary | ICD-10-CM | POA: Insufficient documentation

## 2014-08-04 DIAGNOSIS — S0993XA Unspecified injury of face, initial encounter: Secondary | ICD-10-CM | POA: Diagnosis present

## 2014-08-04 DIAGNOSIS — S0083XA Contusion of other part of head, initial encounter: Secondary | ICD-10-CM | POA: Insufficient documentation

## 2014-08-04 DIAGNOSIS — Y998 Other external cause status: Secondary | ICD-10-CM | POA: Diagnosis not present

## 2014-08-04 DIAGNOSIS — Z79899 Other long term (current) drug therapy: Secondary | ICD-10-CM | POA: Insufficient documentation

## 2014-08-04 DIAGNOSIS — Z792 Long term (current) use of antibiotics: Secondary | ICD-10-CM | POA: Diagnosis not present

## 2014-08-04 DIAGNOSIS — Y9289 Other specified places as the place of occurrence of the external cause: Secondary | ICD-10-CM | POA: Insufficient documentation

## 2014-08-04 DIAGNOSIS — X58XXXA Exposure to other specified factors, initial encounter: Secondary | ICD-10-CM | POA: Diagnosis not present

## 2014-08-04 DIAGNOSIS — J0110 Acute frontal sinusitis, unspecified: Secondary | ICD-10-CM

## 2014-08-04 DIAGNOSIS — J019 Acute sinusitis, unspecified: Secondary | ICD-10-CM | POA: Insufficient documentation

## 2014-08-04 DIAGNOSIS — Y9389 Activity, other specified: Secondary | ICD-10-CM | POA: Insufficient documentation

## 2014-08-04 DIAGNOSIS — Z8619 Personal history of other infectious and parasitic diseases: Secondary | ICD-10-CM | POA: Diagnosis not present

## 2014-08-04 MED ORDER — AMOXICILLIN 250 MG PO CAPS
500.0000 mg | ORAL_CAPSULE | Freq: Once | ORAL | Status: AC
  Administered 2014-08-04: 500 mg via ORAL
  Filled 2014-08-04: qty 2

## 2014-08-04 MED ORDER — TRAMADOL HCL 50 MG PO TABS
50.0000 mg | ORAL_TABLET | ORAL | Status: AC
  Administered 2014-08-04: 50 mg via ORAL
  Filled 2014-08-04: qty 1

## 2014-08-04 MED ORDER — TRAMADOL HCL 50 MG PO TABS: 50.0000 mg | ORAL_TABLET | Freq: Four times a day (QID) | ORAL | Status: DC | PRN

## 2014-08-04 MED ORDER — AMOXICILLIN 500 MG PO CAPS: 500.0000 mg | ORAL_CAPSULE | Freq: Two times a day (BID) | ORAL | Status: DC

## 2014-08-04 NOTE — ED Notes (Signed)
Pt was seen at [facility] in [facility] and was told she had a broken nose. Pt states she is here for pain meds.
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
## 2014-08-04 NOTE — ED Notes (Signed)
Pt ready for d/c when seen by me

## 2014-08-04 NOTE — Discharge Instructions (Signed)
You have been given a prescription for Ultram that he continues for pain control.  This can be used in conjunction with Tylenol and ibuprofen.  You've also been given a prescription for amoxicillin, which is an antibiotic that you can take potential infection

## 2016-08-03 ENCOUNTER — Encounter (HOSPITAL_COMMUNITY): Payer: Self-pay | Admitting: Emergency Medicine

## 2016-08-03 ENCOUNTER — Emergency Department (HOSPITAL_COMMUNITY)
Admission: EM | Admit: 2016-08-03 | Discharge: 2016-08-03 | Disposition: A | Discharge status: Home / Self Care | Payer: Medicaid Other | Attending: Emergency Medicine | Admitting: Emergency Medicine

## 2016-08-03 ENCOUNTER — Inpatient Hospital Stay (HOSPITAL_COMMUNITY)
Admission: AD | Admit: 2016-08-03 | Discharge: 2016-08-07 | DRG: 885 | Disposition: A | Discharge status: Home / Self Care | Payer: No Typology Code available for payment source | Source: Intra-hospital | Attending: Psychiatry | Admitting: Psychiatry

## 2016-08-03 ENCOUNTER — Encounter (HOSPITAL_COMMUNITY): Payer: Self-pay | Admitting: *Deleted

## 2016-08-03 DIAGNOSIS — Z810 Family history of intellectual disabilities: Secondary | ICD-10-CM | POA: Diagnosis not present

## 2016-08-03 DIAGNOSIS — Z885 Allergy status to narcotic agent status: Secondary | ICD-10-CM | POA: Diagnosis not present

## 2016-08-03 DIAGNOSIS — F1120 Opioid dependence, uncomplicated: Secondary | ICD-10-CM | POA: Diagnosis present

## 2016-08-03 DIAGNOSIS — F1410 Cocaine abuse, uncomplicated: Secondary | ICD-10-CM | POA: Diagnosis present

## 2016-08-03 DIAGNOSIS — F314 Bipolar disorder, current episode depressed, severe, without psychotic features: Principal | ICD-10-CM | POA: Diagnosis present

## 2016-08-03 DIAGNOSIS — F1490 Cocaine use, unspecified, uncomplicated: Secondary | ICD-10-CM | POA: Diagnosis not present

## 2016-08-03 DIAGNOSIS — J4520 Mild intermittent asthma, uncomplicated: Secondary | ICD-10-CM | POA: Diagnosis present

## 2016-08-03 DIAGNOSIS — F17210 Nicotine dependence, cigarettes, uncomplicated: Secondary | ICD-10-CM | POA: Diagnosis present

## 2016-08-03 DIAGNOSIS — F17200 Nicotine dependence, unspecified, uncomplicated: Secondary | ICD-10-CM | POA: Insufficient documentation

## 2016-08-03 DIAGNOSIS — F1290 Cannabis use, unspecified, uncomplicated: Secondary | ICD-10-CM | POA: Diagnosis not present

## 2016-08-03 DIAGNOSIS — Z62810 Personal history of physical and sexual abuse in childhood: Secondary | ICD-10-CM | POA: Diagnosis not present

## 2016-08-03 DIAGNOSIS — F1990 Other psychoactive substance use, unspecified, uncomplicated: Secondary | ICD-10-CM

## 2016-08-03 DIAGNOSIS — Z91030 Bee allergy status: Secondary | ICD-10-CM | POA: Diagnosis not present

## 2016-08-03 DIAGNOSIS — F4310 Post-traumatic stress disorder, unspecified: Secondary | ICD-10-CM | POA: Diagnosis present

## 2016-08-03 DIAGNOSIS — Z79899 Other long term (current) drug therapy: Secondary | ICD-10-CM | POA: Insufficient documentation

## 2016-08-03 DIAGNOSIS — G8929 Other chronic pain: Secondary | ICD-10-CM | POA: Diagnosis present

## 2016-08-03 DIAGNOSIS — J45909 Unspecified asthma, uncomplicated: Secondary | ICD-10-CM | POA: Insufficient documentation

## 2016-08-03 DIAGNOSIS — R45851 Suicidal ideations: Secondary | ICD-10-CM | POA: Diagnosis present

## 2016-08-03 DIAGNOSIS — F1190 Opioid use, unspecified, uncomplicated: Secondary | ICD-10-CM | POA: Diagnosis not present

## 2016-08-03 DIAGNOSIS — G4700 Insomnia, unspecified: Secondary | ICD-10-CM | POA: Diagnosis present

## 2016-08-03 DIAGNOSIS — F1220 Cannabis dependence, uncomplicated: Secondary | ICD-10-CM | POA: Diagnosis present

## 2016-08-03 DIAGNOSIS — F419 Anxiety disorder, unspecified: Secondary | ICD-10-CM | POA: Diagnosis present

## 2016-08-03 DIAGNOSIS — A159 Respiratory tuberculosis unspecified: Secondary | ICD-10-CM

## 2016-08-03 LAB — COMPREHENSIVE METABOLIC PANEL
ALT: 13 U/L — ABNORMAL LOW (ref 14–54)
AST: 22 U/L (ref 15–41)
Albumin: 4.1 g/dL (ref 3.5–5.0)
Alkaline Phosphatase: 63 U/L (ref 38–126)
Anion gap: 9 (ref 5–15)
BUN: 13 mg/dL (ref 6–20)
CALCIUM: 9.3 mg/dL (ref 8.9–10.3)
CHLORIDE: 103 mmol/L (ref 101–111)
CO2: 25 mmol/L (ref 22–32)
CREATININE: 0.91 mg/dL (ref 0.44–1.00)
Glucose, Bld: 106 mg/dL — ABNORMAL HIGH (ref 65–99)
Potassium: 4.1 mmol/L (ref 3.5–5.1)
Sodium: 137 mmol/L (ref 135–145)
Total Bilirubin: 0.7 mg/dL (ref 0.3–1.2)
Total Protein: 7.4 g/dL (ref 6.5–8.1)

## 2016-08-03 LAB — CBC
HCT: 43.4 % (ref 36.0–46.0)
Hemoglobin: 14.2 g/dL (ref 12.0–15.0)
MCH: 29.6 pg (ref 26.0–34.0)
MCHC: 32.7 g/dL (ref 30.0–36.0)
MCV: 90.4 fL (ref 78.0–100.0)
PLATELETS: 237 10*3/uL (ref 150–400)
RBC: 4.8 MIL/uL (ref 3.87–5.11)
RDW: 14.1 % (ref 11.5–15.5)
WBC: 12.9 10*3/uL — ABNORMAL HIGH (ref 4.0–10.5)

## 2016-08-03 LAB — RAPID URINE DRUG SCREEN, HOSP PERFORMED
Amphetamines: NOT DETECTED
Barbiturates: NOT DETECTED
Benzodiazepines: NOT DETECTED
Cocaine: POSITIVE — AB
OPIATES: POSITIVE — AB
Tetrahydrocannabinol: POSITIVE — AB

## 2016-08-03 LAB — SALICYLATE LEVEL

## 2016-08-03 LAB — ETHANOL

## 2016-08-03 LAB — ACETAMINOPHEN LEVEL: Acetaminophen (Tylenol), Serum: <10 ug/mL — ABNORMAL LOW (ref 10–30)

## 2016-08-03 LAB — POC URINE PREG, ED: Preg Test, Ur: NEGATIVE

## 2016-08-03 MED ORDER — LOPERAMIDE HCL 2 MG PO CAPS: 2.0000 mg | ORAL_CAPSULE | ORAL | Status: DC | PRN

## 2016-08-03 MED ORDER — METHOCARBAMOL 500 MG PO TABS
500.0000 mg | ORAL_TABLET | Freq: Three times a day (TID) | ORAL | Status: DC | PRN
  Administered 2016-08-03: 500 mg via ORAL
  Filled 2016-08-03: qty 1

## 2016-08-03 MED ORDER — ALUM & MAG HYDROXIDE-SIMETH 200-200-20 MG/5ML PO SUSP: 30.0000 mL | ORAL | Status: DC | PRN

## 2016-08-03 MED ORDER — ACETAMINOPHEN 325 MG PO TABS: 650.0000 mg | ORAL_TABLET | Freq: Four times a day (QID) | ORAL | Status: DC | PRN

## 2016-08-03 MED ORDER — NAPROXEN 500 MG PO TABS
500.0000 mg | ORAL_TABLET | Freq: Two times a day (BID) | ORAL | Status: DC | PRN
  Administered 2016-08-03: 500 mg via ORAL
  Filled 2016-08-03: qty 1

## 2016-08-03 MED ORDER — GABAPENTIN 300 MG PO CAPS
300.0000 mg | ORAL_CAPSULE | Freq: Three times a day (TID) | ORAL | Status: DC
  Administered 2016-08-03: 300 mg via ORAL
  Filled 2016-08-03: qty 1

## 2016-08-03 MED ORDER — ENSURE ENLIVE PO LIQD
237.0000 mL | Freq: Two times a day (BID) | ORAL | Status: DC
  Administered 2016-08-04 – 2016-08-06 (×5): 237 mL via ORAL

## 2016-08-03 MED ORDER — DICYCLOMINE HCL 20 MG PO TABS: 20.0000 mg | ORAL_TABLET | Freq: Four times a day (QID) | ORAL | Status: DC | PRN

## 2016-08-03 MED ORDER — ACETAMINOPHEN 325 MG PO TABS: 650.0000 mg | ORAL_TABLET | ORAL | Status: DC | PRN

## 2016-08-03 MED ORDER — ONDANSETRON 4 MG PO TBDP: 4.0000 mg | ORAL_TABLET | Freq: Four times a day (QID) | ORAL | Status: DC | PRN

## 2016-08-03 MED ORDER — CLONIDINE HCL 0.1 MG PO TABS: 0.1000 mg | ORAL_TABLET | ORAL | Status: DC

## 2016-08-03 MED ORDER — ONDANSETRON HCL 4 MG PO TABS: 4.0000 mg | ORAL_TABLET | Freq: Three times a day (TID) | ORAL | Status: DC | PRN

## 2016-08-03 MED ORDER — CLONIDINE HCL 0.1 MG PO TABS: 0.1000 mg | ORAL_TABLET | Freq: Every day | ORAL | Status: DC

## 2016-08-03 MED ORDER — DICYCLOMINE HCL 20 MG PO TABS
20.0000 mg | ORAL_TABLET | Freq: Four times a day (QID) | ORAL | Status: DC | PRN
  Administered 2016-08-04 (×2): 20 mg via ORAL
  Filled 2016-08-03 (×2): qty 1

## 2016-08-03 MED ORDER — CLONIDINE HCL 0.1 MG PO TABS
0.1000 mg | ORAL_TABLET | Freq: Four times a day (QID) | ORAL | Status: DC
  Administered 2016-08-03 (×2): 0.1 mg via ORAL
  Filled 2016-08-03 (×2): qty 1

## 2016-08-03 MED ORDER — HYDROXYZINE HCL 25 MG PO TABS
25.0000 mg | ORAL_TABLET | Freq: Four times a day (QID) | ORAL | Status: DC | PRN
Start: 2016-08-03 — End: 2016-08-03
  Administered 2016-08-03: 25 mg via ORAL
  Filled 2016-08-03: qty 1

## 2016-08-03 MED ORDER — NICOTINE 21 MG/24HR TD PT24
21.0000 mg | MEDICATED_PATCH | Freq: Every day | TRANSDERMAL | Status: DC
  Administered 2016-08-03: 21 mg via TRANSDERMAL
  Filled 2016-08-03: qty 1

## 2016-08-03 MED ORDER — NAPROXEN 500 MG PO TABS
500.0000 mg | ORAL_TABLET | Freq: Two times a day (BID) | ORAL | Status: DC | PRN
  Filled 2016-08-03: qty 1

## 2016-08-03 MED ORDER — ONDANSETRON 4 MG PO TBDP
4.0000 mg | ORAL_TABLET | Freq: Four times a day (QID) | ORAL | Status: DC | PRN
  Administered 2016-08-04: 4 mg via ORAL
  Filled 2016-08-03: qty 2
  Filled 2016-08-03: qty 1

## 2016-08-03 MED ORDER — HYDROXYZINE HCL 25 MG PO TABS
25.0000 mg | ORAL_TABLET | Freq: Four times a day (QID) | ORAL | Status: DC | PRN
  Administered 2016-08-04 – 2016-08-05 (×3): 25 mg via ORAL
  Filled 2016-08-03 (×4): qty 1

## 2016-08-03 MED ORDER — MAGNESIUM HYDROXIDE 400 MG/5ML PO SUSP: 30.0000 mL | Freq: Every day | ORAL | Status: DC | PRN

## 2016-08-03 MED ORDER — METHOCARBAMOL 500 MG PO TABS
500.0000 mg | ORAL_TABLET | Freq: Three times a day (TID) | ORAL | Status: DC | PRN
  Administered 2016-08-04: 500 mg via ORAL
  Filled 2016-08-03: qty 1

## 2016-08-03 MED ORDER — TRAZODONE HCL 50 MG PO TABS
50.0000 mg | ORAL_TABLET | Freq: Every evening | ORAL | Status: DC | PRN
  Administered 2016-08-03 – 2016-08-04 (×2): 50 mg via ORAL
  Filled 2016-08-03 (×4): qty 1
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
## 2016-08-03 NOTE — ED Notes (Signed)
Pt wanded by security and belonging secured at nursing station.

## 2016-08-03 NOTE — ED Triage Notes (Signed)
Pt reports having suicidal ideation for the last few days after coming off of heroine. Pt states she was using heavily and has a plan of cutting her self in order to kill herself.

## 2016-08-03 NOTE — ED Notes (Signed)
Pt being evaluated py TTS at this time via [facility]

## 2016-08-03 NOTE — ED Notes (Signed)
Patient is requesting Klonopin for sleeping.

## 2016-08-03 NOTE — ED Provider Notes (Signed)
[facility] DEPT Provider Note   CSN: [id number] Arrival date & time: 08/03/16  0131     History   Chief Complaint Chief Complaint  Patient presents with  . Suicidal    HPI Haley Olsen is a 26 y.o. female.  Patient presents to the ED with suicidal ideations, with plan to cut wrists. She states she is addicted to heroin and has been using heavily over the last week. She states she feels hopeless and that she has no choice but to kill herself. No HI, AVH.   The history is provided by the patient. No language interpreter was used.    Past Medical History:  Diagnosis Date  . Asthma   . Chlamydia     Patient Active Problem List   Diagnosis Date Noted  . Pain in joint, ankle and foot 11/12/2012  . Mechanical back pain 05/01/2011  . TOBACCO USER 03/03/2009  . ASTHMA, INTERMITTENT 07/23/2006    Past Surgical History:  Procedure Laterality Date  . NO PAST SURGERIES      OB History    Gravida Para Term Preterm AB Living   1             SAB TAB Ectopic Multiple Live Births                   Home Medications    Prior to Admission medications   Medication Sig Start Date End Date Taking? Authorizing Provider  ondansetron (ZOFRAN-ODT) 4 MG disintegrating tablet Take 4 mg by mouth every 8 (eight) hours as needed for nausea or vomiting.   Yes Historical Provider, MD    Family History Family History  Problem Relation Age of Onset  . Dementia Maternal Grandmother   . Heart disease Maternal Grandmother     Social History Social History  Substance Use Topics  . Smoking status: Current Every Day Smoker    Last attempt to quit: 12/20/2010  . Smokeless tobacco: Never Used  . Alcohol use No     Allergies   Wasp venom protein and Hydrocodone   Review of Systems Review of Systems  Constitutional: Positive for fatigue. Negative for chills and fever.  HENT: Negative.   Respiratory: Negative.   Cardiovascular: Negative.   Gastrointestinal: Negative.    Musculoskeletal: Negative.   Skin: Negative.   Neurological: Negative.   Psychiatric/Behavioral: Positive for suicidal ideas. Negative for hallucinations.     Physical Exam Updated Vital Signs BP 120/78 (BP Location: Right Arm)   Pulse 96   Temp 98.6 F (37 C) (Oral)   Resp 18   Ht 5\' 4"  (1.626 m)   Wt 49.9 kg   LMP  (LMP Unknown)   SpO2 95%   BMI 18.88 kg/m   Physical Exam  Constitutional: She is oriented to person, place, and time. She appears well-developed and well-nourished.  Neck: Normal range of motion.  Pulmonary/Chest: Effort normal.  Musculoskeletal: Normal range of motion.  Neurological: She is alert and oriented to person, place, and time.  Skin: Skin is warm and dry.  Multiple sites of injection to left arm without evidence infection or abscess.      ED Treatments / Results  Labs (all labs ordered are listed, but only abnormal results are displayed) Labs Reviewed  CBC - Abnormal; Notable for the following:       Result Value   WBC 12.9 (*)    All other components within normal limits  COMPREHENSIVE METABOLIC PANEL  ETHANOL  SALICYLATE LEVEL  ACETAMINOPHEN LEVEL  RAPID URINE DRUG SCREEN, HOSP PERFORMED  POC URINE PREG, ED    EKG  EKG Interpretation None       Radiology No results found.  Procedures Procedures (including critical care time)  Medications Ordered in ED Medications - No data to display   Initial Impression / Assessment and Plan / ED Course  I have reviewed the triage vital signs and the nursing notes.  Pertinent labs & imaging results that were available during my care of the patient were reviewed by me and considered in my medical decision making (see chart for details).     Patient with heroin addiction with heavy use recently, now with SI. No HI, AVH. TTS consultation will be required to determine disposition.  Final Clinical Impressions(s) / ED Diagnoses   Final diagnoses:  None   1. Heroin addiction 2.  SI  New Prescriptions New Prescriptions   No medications on file     Charlann Lange, PA-C 08/03/16 [place], PA-C 08/03/16 0316    Charlann Lange, PA-C 08/03/16 [place], MD 08/03/16 [phone]

## 2016-08-03 NOTE — ED Notes (Signed)
SBAR Report received from previous nurse. Pt received calm and visible on unit. Pt denies current SI/ HI, A/V H, or pain at this time, and appears otherwise stable and free of distress pt endorses anxiety and depression. Pt reminded of camera surveillance, q 15 min rounds, and rules of the milieu. Will continue to assess.

## 2016-08-03 NOTE — Tx Team (Signed)
Initial Treatment Plan 08/03/2016 11:50 PM Haley Olsen Tino ZMO:[id number]    PATIENT STRESSORS: Financial difficulties Health problems Marital or family conflict Substance abuse Traumatic event   PATIENT STRENGTHS: Average or above average intelligence Motivation for treatment/growth   PATIENT IDENTIFIED PROBLEMS: Substance abuse  Suicidal ideation  Depression  "get healthy"  "feel normal again"             DISCHARGE CRITERIA:  Improved stabilization in mood, thinking, and/or behavior Motivation to continue treatment in a less acute level of care Need for constant or close observation no longer present Withdrawal symptoms are absent or subacute and managed without 24-hour nursing intervention  PRELIMINARY DISCHARGE PLAN: Attend 12-step recovery group Outpatient therapy Placement in alternative living arrangements  PATIENT/FAMILY INVOLVEMENT: This treatment plan has been presented to and reviewed with the patient, Haley Olsen.  The patient and family have been given the opportunity to ask questions and make suggestions.  Margaretann Loveless, RN 08/03/2016, 11:50 PM

## 2016-08-03 NOTE — ED Notes (Signed)
Orders received to discharge patient to [facility]. Pt aware and discharge summery reviewed with patient as well as review of medications. All personal items return and patient signed all discharge paperwork. Pt escorted off unit.

## 2016-08-03 NOTE — ED Notes (Signed)
Pt reports she missed her vein when using heroin in her left inner arm. Raised area noted at left inner arm. This nurse notified EDP. EDP reports will come on unit to assess.
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
## 2016-08-03 NOTE — ED Notes (Signed)
Bed: WTR5 Expected date:  Expected time:  Means of arrival:  Comments: 
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
## 2016-08-04 DIAGNOSIS — F1490 Cocaine use, unspecified, uncomplicated: Secondary | ICD-10-CM

## 2016-08-04 DIAGNOSIS — F1290 Cannabis use, unspecified, uncomplicated: Secondary | ICD-10-CM

## 2016-08-04 DIAGNOSIS — Z810 Family history of intellectual disabilities: Secondary | ICD-10-CM

## 2016-08-04 DIAGNOSIS — F1190 Opioid use, unspecified, uncomplicated: Secondary | ICD-10-CM

## 2016-08-04 DIAGNOSIS — Z79899 Other long term (current) drug therapy: Secondary | ICD-10-CM

## 2016-08-04 DIAGNOSIS — F1120 Opioid dependence, uncomplicated: Secondary | ICD-10-CM | POA: Diagnosis present

## 2016-08-04 DIAGNOSIS — R45851 Suicidal ideations: Secondary | ICD-10-CM

## 2016-08-04 MED ORDER — CLONIDINE HCL 0.1 MG PO TABS
0.1000 mg | ORAL_TABLET | Freq: Four times a day (QID) | ORAL | Status: AC
  Administered 2016-08-04 – 2016-08-05 (×3): 0.1 mg via ORAL
  Filled 2016-08-04 (×8): qty 1

## 2016-08-04 MED ORDER — TRAZODONE HCL 50 MG PO TABS
50.0000 mg | ORAL_TABLET | Freq: Every day | ORAL | Status: DC
  Filled 2016-08-04 (×2): qty 1

## 2016-08-04 MED ORDER — CLONIDINE HCL 0.1 MG PO TABS
0.1000 mg | ORAL_TABLET | ORAL | Status: DC
  Administered 2016-08-06 – 2016-08-07 (×2): 0.1 mg via ORAL
  Filled 2016-08-04 (×4): qty 1

## 2016-08-04 MED ORDER — CITALOPRAM HYDROBROMIDE 10 MG PO TABS
10.0000 mg | ORAL_TABLET | Freq: Every day | ORAL | Status: DC
  Administered 2016-08-04 – 2016-08-07 (×4): 10 mg via ORAL
  Filled 2016-08-04 (×4): qty 1
  Filled 2016-08-04: qty 7
  Filled 2016-08-04 (×2): qty 1

## 2016-08-04 MED ORDER — METHOCARBAMOL 500 MG PO TABS
500.0000 mg | ORAL_TABLET | Freq: Three times a day (TID) | ORAL | Status: DC | PRN
  Administered 2016-08-04 – 2016-08-06 (×5): 500 mg via ORAL
  Filled 2016-08-04 (×5): qty 1

## 2016-08-04 MED ORDER — IBUPROFEN 800 MG PO TABS
800.0000 mg | ORAL_TABLET | Freq: Three times a day (TID) | ORAL | Status: DC | PRN
  Administered 2016-08-04 – 2016-08-06 (×5): 800 mg via ORAL
  Filled 2016-08-04 (×5): qty 1

## 2016-08-04 MED ORDER — NICOTINE 21 MG/24HR TD PT24
21.0000 mg | MEDICATED_PATCH | Freq: Every day | TRANSDERMAL | Status: DC
  Administered 2016-08-04 – 2016-08-07 (×4): 21 mg via TRANSDERMAL
  Filled 2016-08-04 (×7): qty 1

## 2016-08-04 MED ORDER — CLONIDINE HCL 0.1 MG PO TABS
0.1000 mg | ORAL_TABLET | Freq: Every day | ORAL | Status: DC
  Filled 2016-08-04 (×2): qty 1

## 2016-08-04 MED ORDER — DICYCLOMINE HCL 20 MG PO TABS
20.0000 mg | ORAL_TABLET | Freq: Four times a day (QID) | ORAL | Status: DC | PRN
Start: 2016-08-04 — End: 2016-08-07
  Administered 2016-08-04: 20 mg via ORAL
  Filled 2016-08-04: qty 1

## 2016-08-04 MED ORDER — ONDANSETRON 4 MG PO TBDP
8.0000 mg | ORAL_TABLET | Freq: Four times a day (QID) | ORAL | Status: DC | PRN
Start: 2016-08-04 — End: 2016-08-04
  Administered 2016-08-04 (×2): 8 mg via ORAL
  Filled 2016-08-04: qty 2

## 2016-08-04 MED ORDER — ONDANSETRON 4 MG PO TBDP
4.0000 mg | ORAL_TABLET | Freq: Four times a day (QID) | ORAL | Status: DC | PRN
  Administered 2016-08-04 – 2016-08-07 (×7): 4 mg via ORAL
  Filled 2016-08-04 (×7): qty 1

## 2016-08-04 MED ORDER — LOPERAMIDE HCL 2 MG PO CAPS
2.0000 mg | ORAL_CAPSULE | ORAL | Status: DC | PRN
Start: 2016-08-04 — End: 2016-08-07

## 2016-08-04 MED ORDER — NAPROXEN 500 MG PO TABS
500.0000 mg | ORAL_TABLET | Freq: Two times a day (BID) | ORAL | Status: DC | PRN
  Administered 2016-08-04: 500 mg via ORAL
  Filled 2016-08-04: qty 1

## 2016-08-04 MED ORDER — TRAZODONE HCL 100 MG PO TABS
100.0000 mg | ORAL_TABLET | Freq: Every day | ORAL | Status: DC
Start: 2016-08-04 — End: 2016-08-06
  Administered 2016-08-04 – 2016-08-05 (×2): 100 mg via ORAL
  Filled 2016-08-04 (×3): qty 1

## 2016-08-04 MED ORDER — HYDROXYZINE HCL 25 MG PO TABS
25.0000 mg | ORAL_TABLET | Freq: Four times a day (QID) | ORAL | Status: DC | PRN
  Administered 2016-08-04 (×2): 25 mg via ORAL
  Filled 2016-08-04: qty 1
  Filled 2016-08-04: qty 10

## 2016-08-04 NOTE — Progress Notes (Signed)
Recreation Therapy Notes  Date: 08/04/16 Time: 0930 Location: 300 Hall Dayroom  Group Topic: Stress Management  Goal Area(s) Addresses:  Patient will verbalize importance of using healthy stress management.  Patient will identify positive emotions associated with healthy stress management.   Intervention: Stress Management  Activity :  Letting Go Meditation.  LRT introduced the stress management technique of meditation.  LRT played a meditation from the Calm app focused on letting go.  Patients were to follow along as meditation played to engage in the technique.  Education:  Stress Management, Discharge Planning.   Education Outcome: Acknowledges edcuation/In group clarification offered/Needs additional education  Clinical Observations/Feedback: Pt did not attend group.   Victorino Sparrow, LRT/CTRS         Victorino Sparrow A 08/04/2016 12:32 PM
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
## 2016-08-04 NOTE — Progress Notes (Signed)
Pt up vomiting, sweaty.  COW of 14.  Medication given as ordered, NP notified with further orders received.  Will continue to monitor.

## 2016-08-04 NOTE — B[facility] Group Notes (Signed)
[place] LCSW Group Therapy  08/04/2016 1:38 PM  Type of Therapy:  Group Therapy  Participation Level:  Did Not Attend-pt invited. Chose to remain in bed.   Summary of Progress/Problems: Today's Topic: Overcoming Obstacles. Patients identified one short term goal and potential obstacles in reaching this goal. Patients processed barriers involved in overcoming these obstacles. Patients identified steps necessary for overcoming these obstacles and explored motivation (internal and external) for facing these difficulties head on.   Danecia Underdown N Smart LCSW 08/04/2016, 1:38 PM

## 2016-08-04 NOTE — Progress Notes (Addendum)
Patient ID: Tram Wrenn, female   DOB: March 11, 1991, 26 y.o.   MRN: [id number]  DAR: Pt. Denies SI/HI and A/V Hallucinations. She reports sleep is poor, appetite is poor, energy level is low, and concentration is good. She rates depression 8/10, hopelessness 5/10, and anxiety 7/10. Support and encouragement provided to the patient. Patient refused her 1700 Clonidine but otherwise compliant with her scheduled medications. She received PRN medications for her withdrawal symptoms which included nausea, abdominal cramping, sweats, irritability, anxiety, muscle spasms, and pain. She reported feeling much before before dinner and therefore refused that scheduled Clonidine. She reported lightheadedness this morning and received gatorade. Her vital signs have been stable throughout the day. Patient is initially irritable and isolative in her room however is seen in the milieu more as the day progresses. She is seen interacting with some of her peers and filled out her suicide safety plan and daily inventory sheet. Q15 minute checks are maintained for safety.
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
## 2016-08-04 NOTE — Tx Team (Signed)
Interdisciplinary Treatment and Diagnostic Plan Update  08/04/2016 Time of Session: [place] MRN: [id number]  Principal Diagnosis: Bipolar I Disorder, Current Episode Depressed, Severe Without Psychotic Features; Secondary Diagnoses: Active Problems:   Bipolar 1 disorder, depressed, severe (HCC)   Current Medications:  Current Facility-Administered Medications  Medication Dose Route Frequency Provider Last Rate Last Dose  . acetaminophen (TYLENOL) tablet 650 mg  650 mg Oral Q6H PRN Rozetta Nunnery, NP      . alum & mag hydroxide-simeth (MAALOX/MYLANTA) 200-200-20 MG/5ML suspension 30 mL  30 mL Oral Q4H PRN Rozetta Nunnery, NP      . dicyclomine (BENTYL) tablet 20 mg  20 mg Oral Q6H PRN Rozetta Nunnery, NP   20 mg at 08/04/16 0802  . feeding supplement (ENSURE ENLIVE) (ENSURE ENLIVE) liquid 237 mL  237 mL Oral BID BM Myer Peer Cobos, MD   237 mL at 08/04/16 1052  . hydrOXYzine (ATARAX/VISTARIL) tablet 25 mg  25 mg Oral Q6H PRN Rozetta Nunnery, NP   25 mg at 08/04/16 0015  . loperamide (IMODIUM) capsule 2-4 mg  2-4 mg Oral PRN Rozetta Nunnery, NP      . magnesium hydroxide (MILK OF MAGNESIA) suspension 30 mL  30 mL Oral Daily PRN Rozetta Nunnery, NP      . methocarbamol (ROBAXIN) tablet 500 mg  500 mg Oral Q8H PRN Rozetta Nunnery, NP   500 mg at 08/04/16 0015  . naproxen (NAPROSYN) tablet 500 mg  500 mg Oral BID PRN Rozetta Nunnery, NP      . nicotine (NICODERM CQ - dosed in mg/24 hours) patch 21 mg  21 mg Transdermal Daily Jenne Campus, MD   21 mg at 08/04/16 0800  . ondansetron (ZOFRAN-ODT) disintegrating tablet 8 mg  8 mg Oral Q6H PRN Rozetta Nunnery, NP   8 mg at 08/04/16 0802  . traZODone (DESYREL) tablet 50 mg  50 mg Oral QHS,MR X 1 Rozetta Nunnery, NP   50 mg at 08/04/16 0016   PTA Medications: Prescriptions Prior to Admission  Medication Sig Dispense Refill Last Dose  . ondansetron (ZOFRAN-ODT) 4 MG disintegrating tablet Take 4 mg by mouth every 8 (eight) hours as needed for  nausea or vomiting.   08/02/2016 at Unknown time    Patient Stressors: Financial difficulties Health problems Marital or family conflict Substance abuse Traumatic event  Patient Strengths: Average or above average intelligence Motivation for treatment/growth  Treatment Modalities: Medication Management, Group therapy, Case management,  1 to 1 session with clinician, Psychoeducation, Recreational therapy.   Physician Treatment Plan for Primary Diagnosis: Bipolar I Disorder, Current Episode Depressed, Severe Without Psychotic Features; Long Term Goal(s): Improvement in symptoms so as ready for discharge Improvement in symptoms so as ready for discharge   Short Term Goals: Ability to identify changes in lifestyle to reduce recurrence of condition will improve Ability to verbalize feelings will improve Ability to disclose and discuss suicidal ideas Ability to identify changes in lifestyle to reduce recurrence of condition will improve Ability to demonstrate self-control will improve Ability to identify and develop effective coping behaviors will improve Compliance with prescribed medications will improve Ability to identify triggers associated with substance abuse/mental health issues will improve  Medication Management: Evaluate patient's response, side effects, and tolerance of medication regimen.  Therapeutic Interventions: 1 to 1 sessions, Unit Group sessions and Medication administration.  Evaluation of Outcomes: Progressing  Physician Treatment Plan for Secondary Diagnosis: Active Problems:  Bipolar 1 disorder, depressed, severe ([place])  Long Term Goal(s): Improvement in symptoms so as ready for discharge Improvement in symptoms so as ready for discharge   Short Term Goals: Ability to identify changes in lifestyle to reduce recurrence of condition will improve Ability to verbalize feelings will improve Ability to disclose and discuss suicidal ideas Ability to identify  changes in lifestyle to reduce recurrence of condition will improve Ability to demonstrate self-control will improve Ability to identify and develop effective coping behaviors will improve Compliance with prescribed medications will improve Ability to identify triggers associated with substance abuse/mental health issues will improve     Medication Management: Evaluate patient's response, side effects, and tolerance of medication regimen.  Therapeutic Interventions: 1 to 1 sessions, Unit Group sessions and Medication administration.  Evaluation of Outcomes: Progressing   RN Treatment Plan for Primary Diagnosis: Bipolar I Disorder, Current Episode Depressed, Severe Without Psychotic Features; Long Term Goal(s): Knowledge of disease and therapeutic regimen to maintain health will improve  Short Term Goals: Ability to disclose and discuss suicidal ideas and Ability to identify and develop effective coping behaviors will improve  Medication Management: RN will administer medications as ordered by provider, will assess and evaluate patient's response and provide education to patient for prescribed medication. RN will report any adverse and/or side effects to prescribing provider.  Therapeutic Interventions: 1 on 1 counseling sessions, Psychoeducation, Medication administration, Evaluate responses to treatment, Monitor vital signs and CBGs as ordered, Perform/monitor CIWA, COWS, AIMS and Fall Risk screenings as ordered, Perform wound care treatments as ordered.  Evaluation of Outcomes: Progressing   LCSW Treatment Plan for Primary Diagnosis: Bipolar I Disorder, Current Episode Depressed, Severe Without Psychotic Features; Long Term Goal(s): Safe transition to appropriate next level of care at discharge, Engage patient in therapeutic group addressing interpersonal concerns.  Short Term Goals: Engage patient in aftercare planning with referrals and resources, Facilitate patient progression  through stages of change regarding substance use diagnoses and concerns and Identify triggers associated with mental health/substance abuse issues  Therapeutic Interventions: Assess for all discharge needs, 1 to 1 time with Social worker, Explore available resources and support systems, Assess for adequacy in community support network, Educate family and significant other(s) on suicide prevention, Complete Psychosocial Assessment, Interpersonal group therapy.  Evaluation of Outcomes: Progressing   Progress in Treatment: Attending groups: Yes. Participating in groups: Yes. Taking medication as prescribed: Yes. Toleration medication: Yes. Family/Significant other contact made: No, will contact:  family member if patient consents Patient understands diagnosis: Yes. Discussing patient identified problems/goals with staff: Yes. Medical problems stabilized or resolved: Yes. Denies suicidal/homicidal ideation: No. Passive SI/Able to contract for safety on the unit.  Issues/concerns per patient self-inventory: No. Other: n/a  New problem(s) identified: No, Describe:  n/a  New Short Term/Long Term Goal(s): detox; medication stabilization; development of comprehensive mental wellness/sobriety plan.   Discharge Plan or Barriers: CSW assessing for appropriate referrals. Pt has no current providers--first admission to [facility]. Pt reports that she is homeless and unemployed.   Reason for Continuation of Hospitalization: Anxiety Depression Medication stabilization Suicidal ideation Withdrawal symptoms  Estimated Length of Stay: 3-5 days   Attendees: Patient: 08/04/2016 11:20 AM  Physician: Dr. Nelson Chimes MD 08/04/2016 11:20 AM  Nursing: Trinna Post RN; Jinny Sanders RN 08/04/2016 11:20 AM  RN Care Manager: Lars Pinks CM 08/04/2016 11:20 AM  Social Worker: Press photographer, LCSW; Matthew Saras LCSWA  08/04/2016 11:20 AM  Recreational Therapist: Rhunette Croft 08/04/2016 11:20 AM  Other: Ricky Ala NP; Lindell Spar NP 08/04/2016  11:20 AM  Other:  08/04/2016 11:20 AM  Other: 08/04/2016 11:20 AM    Scribe for Treatment Team: [place], LCSW 08/04/2016 11:20 AM

## 2016-08-04 NOTE — Progress Notes (Signed)
Initial Nutrition Assessment   INTERVENTION:  Ensure Enlive po BID, each supplement provides 350 kcal and 20 grams of protein  REASON FOR ASSESSMENT:   Malnutrition Screening Tool    ASSESSMENT:    26 year old Caucasian female admitted to the services of Dr. Parke Poisson for suicidal ideation and heroin/cocaine abuse.    Pt documented eating 100% meals. Pt already ordered for Ensure Enlive. Per RN note, pt vomiting today. Per chart, pt has been weight stable for years but there are no documented wts in 2017. Pt reports that she weighed 140lbs about six months ago; this would be a 31lb(22%) in six months. This is severe. Continue to encourage supplements and meals.   Medications reviewed and include: nictoine, trazodone, maproxen, zofran  Labs reviewed: wbc- 12.9(H)  Unable to complete Nutrition-Focused physical exam at this time.   Diet Order:  Diet regular Room service appropriate? Yes; Fluid consistency: Thin  Skin:  Reviewed, no issues  Last BM:  none since admit  Height:   Ht Readings from Last 1 Encounters:  08/03/16 5\' 4"  (1.626 m)    Weight:   Wt Readings from Last 1 Encounters:  08/03/16 109 lb (49.4 kg)    Ideal Body Weight:  54.5 kg  BMI:  Body mass index is 18.71 kg/m.  Estimated Nutritional Needs:   Kcal:  1500-1700kcal/day   Protein:  69-79g/day   Fluid:  >1.5L/day   Koleen Distance, RD, LDN Pager #- [phone]

## 2016-08-05 DIAGNOSIS — F17210 Nicotine dependence, cigarettes, uncomplicated: Secondary | ICD-10-CM

## 2016-08-05 LAB — TSH: TSH: 0.874 u[IU]/mL (ref 0.350–4.500)

## 2016-08-05 LAB — LIPID PANEL
Cholesterol: 144 mg/dL (ref 0–200)
HDL: 40 mg/dL — AB (ref >40)
LDL Cholesterol: 84 mg/dL (ref 0–99)
Total CHOL/HDL Ratio: 3.6 RATIO
Triglycerides: 101 mg/dL (ref <150)
VLDL: 20 mg/dL (ref 0–40)

## 2016-08-05 NOTE — Progress Notes (Signed)
[facility] MD Progress Note  08/05/2016 3:02 PM Haley Olsen  MRN:  [id number] Subjective:   26 yo Caucasian female with background history of chronic pain and SUD. Self presented on account of suicidal thoughts.  UDS was positive for cocaine THC and opiates.  At interview, patient states that she was sober for over a year. Says she relapsed after a break up. Patient says she came in voluntarily so that she can get clean. Patient says the withdrawals are much better. She is no longer aching. No nausea, vomiting or diarrhea. Says she is now in good spirits. No thoughts of suicide. No thoughts of homicide. No thoughts of violence. No perceptual abnormalities. No evidence of mania. Patient hopes to transition into residential treatment.   Nursing staff reports that she has been doing much better. She has been participating well at groups. No behavioral issues. She slept well on a higher dose of Trazodone.  Principal Problem: Opioid use disorder, severe, dependence ([place]) Diagnosis:   Patient Active Problem List   Diagnosis Date Noted  . Opioid use disorder, severe, dependence ([place]) [F11.20] 08/04/2016  . Pain in joint, ankle and foot [M25.579] 11/12/2012  . Mechanical back pain [M54.9] 05/01/2011  . TOBACCO USER [F17.200] 03/03/2009  . 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 Asthma   . Chlamydia     Past Surgical History:  Procedure Laterality Date  . NO PAST SURGERIES     Family History:  Family History  Problem Relation Age of Onset  . Dementia Maternal Grandmother   . Heart disease Maternal Grandmother    Family Psychiatric  History: As in H&P  Social History:  History  Alcohol Use No     History  Drug Use  . Types: IV, Marijuana    Comment: heroine    Social History   Social History  . Marital status: Single   Spouse name: N/A  . Number of children: N/A  . Years of education: N/A   Social History Main Topics  . Smoking status: Current Every Day Smoker    Packs/day: 0.50    Types: Cigarettes    Last attempt to quit: 12/20/2010  . Smokeless tobacco: Never Used     Comment: declined  . Alcohol use No  . Drug use: Yes    Types: IV, Marijuana     Comment: heroine  . Sexual activity: Yes    Birth control/ protection: Condom, None   Other Topics Concern  . None   Social History Narrative  . None   Additional Social History:        Sleep: Good  Appetite:  Good  Current Medications: Current Facility-Administered Medications  Medication Dose Route Frequency Provider Last Rate Last Dose  . acetaminophen (TYLENOL) tablet 650 mg  650 mg Oral Q6H PRN Rozetta Nunnery, NP      . alum & mag hydroxide-simeth (MAALOX/MYLANTA) 200-200-20 MG/5ML suspension 30 mL  30 mL Oral Q4H PRN Rozetta Nunnery, NP      . citalopram (CELEXA) tablet 10 mg  10 mg Oral Daily Norman Clay, MD   10 mg at 08/05/16 1205  . cloNIDine (CATAPRES) tablet 0.1 mg  0.1 mg Oral QID Encarnacion Slates, NP   0.1 mg at 08/04/16 2202   Followed by  . [START ON 08/06/2016] cloNIDine (CATAPRES) tablet 0.1 mg  0.1 mg Oral BH-qamhs Encarnacion Slates, NP       Followed by  . [START ON 08/08/2016] cloNIDine (CATAPRES) tablet 0.1 mg  0.1 mg Oral QAC breakfast Encarnacion Slates, NP      . dicyclomine (BENTYL) tablet 20 mg  20 mg Oral Q6H PRN Encarnacion Slates, NP   20 mg at 08/04/16 2053  . feeding supplement (ENSURE ENLIVE) (ENSURE ENLIVE) liquid 237 mL  237 mL Oral BID BM Myer Peer Cobos, MD   237 mL at 08/04/16 1413  . hydrOXYzine (ATARAX/VISTARIL) tablet 25 mg  25 mg Oral Q6H PRN Rozetta Nunnery, NP   25 mg at 08/05/16 1205  . hydrOXYzine (ATARAX/VISTARIL) tablet 25 mg  25 mg Oral Q6H PRN Encarnacion Slates, NP   25 mg at 08/04/16 2053  . ibuprofen (ADVIL,MOTRIN) tablet 800 mg  800 mg Oral Q8H PRN Encarnacion Slates, NP   800 mg at 08/05/16 1205  . loperamide  (IMODIUM) capsule 2-4 mg  2-4 mg Oral PRN Encarnacion Slates, NP      . magnesium hydroxide (MILK OF MAGNESIA) suspension 30 mL  30 mL Oral Daily PRN Rozetta Nunnery, NP      . methocarbamol (ROBAXIN) tablet 500 mg  500 mg Oral Q8H PRN Encarnacion Slates, NP   500 mg at 08/05/16 1205  . nicotine (NICODERM CQ - dosed in mg/24 hours) patch 21 mg  21 mg Transdermal Daily Jenne Campus, MD   21 mg at 08/05/16 1208  . ondansetron (ZOFRAN-ODT) disintegrating tablet 4 mg  4 mg Oral Q6H PRN Encarnacion Slates, NP   4 mg at 08/05/16 1205  . traZODone (DESYREL) tablet 100 mg  100 mg Oral QHS Norman Clay, MD   100 mg at 08/04/16 2053    Lab Results:  Results for orders placed or performed during the hospital encounter of 08/03/16 (from the past 48 hour(s))  Lipid panel     Status: Abnormal   Collection Time: 08/05/16  6:13 AM  Result Value Ref Range   Cholesterol 144 0 - 200 mg/dL   Triglycerides 101 <150 mg/dL   HDL 40 (L) >40 mg/dL   Total CHOL/HDL Ratio 3.6 RATIO   VLDL 20 0 - 40 mg/dL   LDL Cholesterol 84 0 - 99 mg/dL    Comment:        Total Cholesterol/HDL:CHD Risk Coronary Heart Disease Risk Table                     Men   Women  1/2 Average Risk   3.4   3.3  Average Risk       5.0   4.4  2 X Average Risk   9.6   7.1  3 X Average Risk  23.4   11.0        Use the calculated Patient Ratio above and the CHD Risk Table to determine the patient's CHD Risk.        ATP III CLASSIFICATION (LDL):  <100     mg/dL   Optimal  100-129  mg/dL   Near or Above                    Optimal  130-159  mg/dL   Borderline  160-189  mg/dL   High  >190     mg/dL   Very High Performed at [facility] [address] [place], [place] [address]  TSH     Status: None   Collection Time: 08/05/16  6:13 AM  Result Value Ref Range   TSH 0.874 0.350 - 4.500 uIU/mL    Comment: Performed by a 3rd Generation assay with a functional sensitivity of <=0.01 uIU/mL. Performed at [facility], [place]  [address], [place], [place] [address]     Blood Alcohol level:  Lab Results  Component Value Date   ETH <5 87/68/1157    Metabolic Disorder Labs: No results found for: HGBA1C, MPG No results found for: PROLACTIN Lab Results  Component Value Date   CHOL 144 08/05/2016   TRIG 101 08/05/2016   HDL 40 (L) 08/05/2016   CHOLHDL 3.6 08/05/2016   VLDL 20 08/05/2016   LDLCALC 84 08/05/2016    Physical Findings: AIMS: Facial and Oral Movements Muscles of Facial Expression: None, normal Lips and Perioral Area: None, normal Jaw: None, normal Tongue: None, normal,Extremity Movements Upper (arms, wrists, hands, fingers): None, normal Lower (legs, knees, ankles, toes): None, normal, Trunk Movements Neck, shoulders, hips: None, normal, Overall Severity Severity of abnormal movements (highest score from questions above): None, normal Incapacitation due to abnormal movements: None, normal Patient's awareness of abnormal movements (rate only patient's report): No Awareness, Dental Status Current problems with teeth and/or dentures?: No Does patient usually wear dentures?: No  CIWA:    COWS:  COWS Total Score: 9  Musculoskeletal: Strength & Muscle Tone: within normal limits Gait & Station: normal Patient leans: N/A  Psychiatric Specialty Exam: Physical Exam  Constitutional: She is oriented to person, place, and time. She appears well-developed and well-nourished.  HENT:  Head: Normocephalic and atraumatic.  Eyes: Conjunctivae and EOM are normal. Pupils are equal, round, and reactive to light.  Neck: Normal range of motion. Neck supple.  Cardiovascular: Normal rate and regular rhythm.   Respiratory: Effort normal and breath sounds normal.  GI: Soft. Bowel sounds are normal.  Musculoskeletal: Normal range of motion.  Neurological: She is alert and oriented to person, place, and time. She has normal reflexes.  Skin: Skin is warm and dry.    Review of Systems  Constitutional:  Negative.   HENT: Negative.   Eyes: Negative.   Respiratory: Negative.   Cardiovascular: Negative.   Gastrointestinal: Negative.   Genitourinary: Negative.   Musculoskeletal: Negative.   Skin: Negative.   Neurological: Negative.   Endo/Heme/Allergies: Negative.   Psychiatric/Behavioral:       As above    Blood pressure 107/69, pulse (!) 57, temperature 98.3 F (36.8 C), temperature source Oral, resp. rate 16, height 5\' 4"  (1.626 m), weight 49.4 kg (109 lb).Body mass index is 18.71 kg/m.  General Appearance: Calm, cooperative, good eye contact. Good relatedness. Appropriate behavior. Patient was at the day room interacting with peers prior to interview.   Eye Contact:  Good  Speech:  Clear and Coherent  Volume:  Normal  Mood:  Euthymic  Affect:  Appropriate and Full Range  Thought Process:  Goal Directed and Linear  Orientation:  Full (Time, Place, and Person)  Thought Content:  No delusional theme. No preoccupation with violent thoughts. No negative ruminations. No obsession.  No hallucination in any modality.   Suicidal Thoughts:  No  Homicidal Thoughts:  No  Memory:  Immediate;   Good Recent;   Good Remote;   Good  Judgement:  Good  Insight:  Good  Psychomotor Activity:  Normal  Concentration:  Concentration: Good and Attention Span: Good  Recall:  Good  Fund of Knowledge:  Good  Language:  Good  Akathisia:  No  Handed:    AIMS (if indicated):     Assets:  Communication Skills Desire for Improvement Physical Health Resilience Social Support  ADL's:  Intact  Cognition:  WNL  Sleep:  Number of Hours: 6.75     Treatment Plan Summary:  Patient is coming off substances smoothly. She is not psychotic, she is not manic, she is not depressed. She is not a danger to herself or others. We would evaluate her further.    Psychiatric: SUD Substance Induced Mood Disorder  Medical: Asthma  Psychosocial:  Homelessness Loss of custody  PLAN: 1. Continue current  regimen 2. Continue to monitor mood, behavior and interaction with peers 3. Hopeful discharge at the end of the week.    [place], MD 08/05/2016, 3:02 PM

## 2016-08-05 NOTE — Progress Notes (Signed)
Pt is having a difficult time tonight with symptoms of withdrawal.  She sat in the dayroom for a little while, but states she "feels like sh--" and does not want the Clonidine at this time, stating it makes her feel worse.  She sat in the evening group, and then came to the med window shortly before 2100 stating that she wanted to go to bed as she was feeling so sleepy.  Pt was given prn medications along with her sleep aid, but she still did not take the Clonidine.  After a little while, it was reported that the pt was in her room vomiting.  The pt had received Zofran around 1820.  Pt was given ginger ale and offered saltine crackers.  Pt declined the crackers.  Pt did take the Clonidine at 2200.  She vomited again and Probation officer called the provider on call.  She was given another dose of Zofran a little early per order of provider.  Pt has call button close by.  Pt denies SI/HI/AVH.  She makes her needs known to staff.  Support and encouragement offered.  Discharge plans are in process and based on the status of her detox.  Pt is monitored for safety q15 minutes.
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
## 2016-08-05 NOTE — Progress Notes (Signed)
D: Haley Olsen denied SI, HI, and AVH today. She's been calm, cooperative, and compliant with medications during the day. However, she did request to come back from dinner because she was feeling anxious. This morning, she was having a little nausea and some S&S of withdrawal. By dinner, she was denying all symptoms but anxiety. She has been pleasant today. On her self inventory, she reported good sleep, good appetite, normal energy level, and good concentration. She rated her depression 3/10, depression 2/10, and anxiety 6/10.   A: Meds given as ordered, including PRN Vistaril 25 mg PO with relief noted. Q15 safety checks maintained. Support/encouragement offered.  R: Pt remains free from harm and continues with treatment. Will continue to monitor for needs/safety.

## 2016-08-06 ENCOUNTER — Ambulatory Visit (HOSPITAL_COMMUNITY)
Admission: RE | Admit: 2016-08-06 | Discharge: 2016-08-06 | Disposition: A | Discharge status: Home / Self Care | Payer: Medicaid Other | Source: Ambulatory Visit | Attending: Psychiatry | Admitting: Psychiatry

## 2016-08-06 ENCOUNTER — Encounter (HOSPITAL_COMMUNITY): Payer: Self-pay | Admitting: Radiology

## 2016-08-06 DIAGNOSIS — E119 Type 2 diabetes mellitus without complications: Secondary | ICD-10-CM | POA: Insufficient documentation

## 2016-08-06 DIAGNOSIS — Z0389 Encounter for observation for other suspected diseases and conditions ruled out: Secondary | ICD-10-CM | POA: Insufficient documentation

## 2016-08-06 DIAGNOSIS — F17210 Nicotine dependence, cigarettes, uncomplicated: Secondary | ICD-10-CM | POA: Insufficient documentation

## 2016-08-06 DIAGNOSIS — J45909 Unspecified asthma, uncomplicated: Secondary | ICD-10-CM | POA: Insufficient documentation

## 2016-08-06 MED ORDER — MIRTAZAPINE 15 MG PO TABS
15.0000 mg | ORAL_TABLET | Freq: Every day | ORAL | Status: DC
  Administered 2016-08-06: 15 mg via ORAL
  Filled 2016-08-06 (×2): qty 1
  Filled 2016-08-06: qty 7

## 2016-08-06 MED ORDER — ONDANSETRON HCL 4 MG/2ML IJ SOLN
INTRAMUSCULAR | Status: AC
  Filled 2016-08-06: qty 2

## 2016-08-06 MED ORDER — ONDANSETRON HCL 4 MG/2ML IJ SOLN
4.0000 mg | Freq: Once | INTRAMUSCULAR | Status: AC
  Administered 2016-08-06: 4 mg via INTRAMUSCULAR
  Filled 2016-08-06: qty 2

## 2016-08-06 MED ORDER — DIPHENHYDRAMINE HCL 25 MG PO CAPS: 25.0000 mg | ORAL_CAPSULE | Freq: Every evening | ORAL | Status: DC | PRN

## 2016-08-06 NOTE — Progress Notes (Signed)
Pt c/o of cyst to left forearm this evening. Pt stated that she's discharging home tomorrow and would like cyst lanced if possible.

## 2016-08-06 NOTE — Progress Notes (Signed)
Pt attend group.

## 2016-08-06 NOTE — BHH Group Notes (Signed)
[facility] LCSW Group Therapy  08/06/2016 1:15pm  Type of Therapy: Group Therapy   Topic: Overcoming Obstacles  Participation Level: Active  Participation Quality: Appropriate   Affect: Appropriate  Cognitive: Appropriate and Oriented  Insight: Developing/Improving and Improving  Engagement in Therapy: Improving  Modes of Intervention: Discussion, Exploration, Problem-solving and Support  Description of Group:  In this group patients will be encouraged to explore what they see as obstacles to their own wellness and recovery. They will be guided to discuss their thoughts, feelings, and behaviors related to these obstacles. The group will process together ways to cope with barriers, with attention given to specific choices patients can make. Each patient will be challenged to identify changes they are motivated to make in order to overcome their obstacles. This group will be process-oriented, with patients participating in exploration of their own experiences as well as giving and receiving support and challenge from other group members.  Summary of Patient Progress:  Pt states that housing is one of her obstacles at the moment. Pt requested information on [employer] and shelters, which CSW provided pt with after group.   Therapeutic Modalities:  Cognitive Behavioral Therapy Solution Focused Therapy Motivational Interviewing Relapse Prevention [place], MSW, [place]

## 2016-08-06 NOTE — Progress Notes (Signed)
[facility] MD Progress Note  08/06/2016 11:06 AM Haley Olsen  MRN:  [id number] Subjective:   26 yo Caucasian female with background history of chronic pain and SUD. Self presented on account of suicidal thoughts.  UDS was positive for cocaine THC and opiates.  Seen today. In good spirits. States that she gets nauseated with Trazodone. Patient would prefer some other medication that would help. We discussed use of Mirtazapine. We explored the risks and benefits. She consented to treatment. She also agreed to use PRN Benadryl. Says she is in good spirits. She does not feel depressed. She is sleeping well at night. She reports normal energy. No suicidal thoughts. No evidence of psychosis. No evidence of mania. No thoughts of violence. No anxiety.   Nursing staff reports that behavior has been normal. She participates with unit activities. She requested Zofran last night. She has not voiced any thoughts of suicide.   Principal Problem: Opioid use disorder, severe, dependence ([place]) Diagnosis:   Patient Active Problem List   Diagnosis Date Noted  . Opioid use disorder, severe, dependence ([place]) [F11.20] 08/04/2016  . Pain in joint, ankle and foot [M25.579] 11/12/2012  . Mechanical back pain [M54.9] 05/01/2011  . TOBACCO USER [F17.200] 03/03/2009  . 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 Asthma   . Chlamydia     Past Surgical History:  Procedure Laterality Date  . NO PAST SURGERIES     Family History:  Family History  Problem Relation Age of Onset  . Dementia Maternal Grandmother   . Heart disease Maternal Grandmother    Family Psychiatric  History: As in H&P  Social History:  History  Alcohol Use No     History  Drug Use  . Types: IV, Marijuana    Comment: heroine    Social History   Social History  . Marital status:  Single    Spouse name: N/A  . Number of children: N/A  . Years of education: N/A   Social History Main Topics  . Smoking status: Current Every Day Smoker    Packs/day: 0.50    Types: Cigarettes    Last attempt to quit: 12/20/2010  . Smokeless tobacco: Never Used     Comment: declined  . Alcohol use No  . Drug use: Yes    Types: IV, Marijuana     Comment: heroine  . Sexual activity: Yes    Birth control/ protection: Condom, None   Other Topics Concern  . None   Social History Narrative  . None   Additional Social History:        Sleep: Good  Appetite:  Good  Current Medications: Current Facility-Administered Medications  Medication Dose Route Frequency Provider Last Rate Last Dose  . acetaminophen (TYLENOL) tablet 650 mg  650 mg Oral Q6H PRN Rozetta Nunnery, NP      . alum & mag hydroxide-simeth (MAALOX/MYLANTA) 200-200-20 MG/5ML suspension 30 mL  30 mL Oral Q4H PRN Rozetta Nunnery, NP      . citalopram (CELEXA) tablet 10 mg  10 mg Oral Daily Norman Clay, MD   10 mg at 08/06/16 0823  . cloNIDine (CATAPRES) tablet 0.1 mg  0.1 mg Oral BH-qamhs Encarnacion Slates, NP   0.1 mg at 08/06/16 0973   Followed by  . [START ON 08/08/2016] cloNIDine (CATAPRES) tablet 0.1 mg  0.1 mg Oral QAC breakfast Encarnacion Slates, NP      . dicyclomine (BENTYL) tablet 20 mg  20 mg Oral Q6H PRN Encarnacion Slates, NP   20 mg at 08/04/16 2053  . feeding supplement (ENSURE ENLIVE) (ENSURE ENLIVE) liquid 237 mL  237 mL Oral BID BM Myer Peer Cobos, MD   237 mL at 08/06/16 2297  . hydrOXYzine (ATARAX/VISTARIL) tablet 25 mg  25 mg Oral Q6H PRN Rozetta Nunnery, NP   25 mg at 08/05/16 1812  . hydrOXYzine (ATARAX/VISTARIL) tablet 25 mg  25 mg Oral Q6H PRN Encarnacion Slates, NP   25 mg at 08/04/16 2053  . ibuprofen (ADVIL,MOTRIN) tablet 800 mg  800 mg Oral Q8H PRN Encarnacion Slates, NP   800 mg at 08/06/16 0827  . loperamide (IMODIUM) capsule 2-4 mg  2-4 mg Oral PRN Encarnacion Slates, NP      . magnesium hydroxide (MILK OF MAGNESIA)  suspension 30 mL  30 mL Oral Daily PRN Rozetta Nunnery, NP      . methocarbamol (ROBAXIN) tablet 500 mg  500 mg Oral Q8H PRN Encarnacion Slates, NP   500 mg at 08/05/16 2105  . nicotine (NICODERM CQ - dosed in mg/24 hours) patch 21 mg  21 mg Transdermal Daily Jenne Campus, MD   21 mg at 08/06/16 0821  . ondansetron (ZOFRAN-ODT) disintegrating tablet 4 mg  4 mg Oral Q6H PRN Encarnacion Slates, NP   4 mg at 08/05/16 2336  . traZODone (DESYREL) tablet 100 mg  100 mg Oral QHS Norman Clay, MD   100 mg at 08/05/16 2104    Lab Results:  Results for orders placed or performed during the hospital encounter of 08/03/16 (from the past 48 hour(s))  Lipid panel     Status: Abnormal   Collection Time: 08/05/16  6:13 AM  Result Value Ref Range   Cholesterol 144 0 - 200 mg/dL   Triglycerides 101 <150 mg/dL   HDL 40 (L) >40 mg/dL   Total CHOL/HDL Ratio 3.6 RATIO   VLDL 20 0 - 40 mg/dL   LDL Cholesterol 84 0 - 99 mg/dL    Comment:        Total Cholesterol/HDL:CHD Risk Coronary Heart Disease Risk Table                     Men   Women  1/2 Average Risk   3.4   3.3  Average Risk       5.0   4.4  2 X Average Risk   9.6   7.1  3 X Average Risk  23.4   11.0        Use the calculated Patient Ratio above and the CHD Risk Table to determine the patient's CHD Risk.        ATP III CLASSIFICATION (LDL):  <100     mg/dL   Optimal  100-129  mg/dL   Near or Above                    Optimal  130-159  mg/dL   Borderline  160-189  mg/dL   High  >190     mg/dL   Very High Performed at [facility] [address] [place], [place] [address]   TSH     Status: None   Collection Time: 08/05/16  6:13 AM  Result Value Ref Range   TSH 0.874 0.350 - 4.500  uIU/mL    Comment: Performed by a 3rd Generation assay with a functional sensitivity of <=0.01 uIU/mL. Performed at [facility], [place] [address], [place], [place] [address]     Blood Alcohol level:  Lab Results  Component Value Date   ETH  <5 93/81/0175    Metabolic Disorder Labs: No results found for: HGBA1C, MPG No results found for: PROLACTIN Lab Results  Component Value Date   CHOL 144 08/05/2016   TRIG 101 08/05/2016   HDL 40 (L) 08/05/2016   CHOLHDL 3.6 08/05/2016   VLDL 20 08/05/2016   LDLCALC 84 08/05/2016    Physical Findings: AIMS: Facial and Oral Movements Muscles of Facial Expression: None, normal Lips and Perioral Area: None, normal Jaw: None, normal Tongue: None, normal,Extremity Movements Upper (arms, wrists, hands, fingers): None, normal Lower (legs, knees, ankles, toes): None, normal, Trunk Movements Neck, shoulders, hips: None, normal, Overall Severity Severity of abnormal movements (highest score from questions above): None, normal Incapacitation due to abnormal movements: None, normal Patient's awareness of abnormal movements (rate only patient's report): No Awareness, Dental Status Current problems with teeth and/or dentures?: No Does patient usually wear dentures?: No  CIWA:    COWS:  COWS Total Score: 6  Musculoskeletal: Strength & Muscle Tone: within normal limits Gait & Station: normal Patient leans: N/A  Psychiatric Specialty Exam: Physical Exam  Constitutional: She is oriented to person, place, and time. She appears well-developed and well-nourished.  HENT:  Head: Normocephalic and atraumatic.  Eyes: Conjunctivae and EOM are normal. Pupils are equal, round, and reactive to light.  Neck: Normal range of motion. Neck supple.  Cardiovascular: Normal rate and regular rhythm.   Respiratory: Effort normal and breath sounds normal.  GI: Soft. Bowel sounds are normal.  Musculoskeletal: Normal range of motion.  Neurological: She is alert and oriented to person, place, and time. She has normal reflexes.  Skin: Skin is warm and dry.    Review of Systems  Constitutional: Negative.   HENT: Negative.   Eyes: Negative.   Respiratory: Negative.   Cardiovascular: Negative.    Gastrointestinal: Negative.   Genitourinary: Negative.   Musculoskeletal: Negative.   Skin: Negative.   Neurological: Negative.   Endo/Heme/Allergies: Negative.   Psychiatric/Behavioral:       As above    Blood pressure (!) 142/84, pulse 65, temperature 98.5 F (36.9 C), temperature source Oral, resp. rate 20, height 5\' 4"  (1.626 m), weight 49.4 kg (109 lb), SpO2 99 %.Body mass index is 18.71 kg/m.  General Appearance: Neatly dressed, pleasant, engaging well and cooperative. Appropriate behavior. Not in any distress. Good relatedness. Not internally stimulated  Eye Contact:  Good  Speech:  Spontaneous, normal prosody. Normal tone and rate.   Volume:  Normal  Mood:  Euthymic  Affect:  Appropriate and Full Range  Thought Process:  Goal Directed and Linear  Orientation:  Full (Time, Place, and Person)  Thought Content:  No delusional theme. No preoccupation with violent thoughts. No negative ruminations. No obsession.  No hallucination in any modality.   Suicidal Thoughts:  No  Homicidal Thoughts:  No  Memory:  Immediate;   Good Recent;   Good Remote;   Good  Judgement:  Good  Insight:  Good  Psychomotor Activity:  Normal  Concentration:  Concentration: Good and Attention Span: Good  Recall:  Good  Fund of Knowledge:  Good  Language:  Good  Akathisia:  No  Handed:    AIMS (if indicated):     Assets:  Communication Skills Desire for Improvement Physical Health Resilience Social Support  ADL's:  Intact  Cognition:  WNL  Sleep:  Number of Hours: 5     Treatment Plan Summary:  Patient is not depressed. She is not psychotic. She is not a danger to herself or others. We adjusted her medications as indicated above. We would evaluate her further.   Psychiatric: SUD Substance Induced Mood Disorder  Medical: Asthma  Psychosocial:  Homelessness Loss of custody  PLAN: 1. Discontinue Trazodone 2. Mirtazapine 15 mg HS 3. Continue to monitor mood, behavior and  interaction with peers 4. Likely discharge on tomorrow/ Friday if she responds well.    Artist [place], MD 08/06/2016, 11:06 AMPatient ID: Haley Olsen, female   DOB: 1991/02/27, 26 y.o.   MRN: [id number]

## 2016-08-06 NOTE — Progress Notes (Signed)
At the beginning of the shift, pt reported that she had a good day.  She said that she has felt better today, but started feeling nauseous just before dinner time.  She was given zofran and said that she ate a few bites of food, but mostly fruit at dinner.  She has been up and participating during the evening.  She denies SI/HI/AVH.  She says her withdrawal symptoms are decreasing.  She is pleasant and cooperative.  She makes her needs known to staff.  Support and encouragement offered.  Discharge plans are in process.  Pt would like to go for long term rehab after detox.  Safety maintained with q15 minute checks.   (0100) Pt has been up since 2330 vomiting as she was last night.  Pt was given a dose of Zofran 4 mg ODT which has not been effect.  Spoke to the provider on call who ordered her to get an IM of Zofran 4 mg which she received at [facility].  At this time, pt is still vomiting.  She was given a cup of ginger ale to sip on.  She took a shower for comfort and returned to bed.  Will continue to monitor.

## 2016-08-06 NOTE — Progress Notes (Signed)
[facility] Group Notes:  (Nursing/MHT/Case Management/Adjunct)  Date:  08/06/2016  Time:  11:32 AM  Type of Therapy:  Nurse Education  Participation Level:  Did Not Attend  Participation Quality:   Affect:    Cognitive:  Insight:  Engagement in Group:  Modes of Intervention:   Summary of Progress/Problems:  Haley Olsen 08/06/2016, 11:32 AM

## 2016-08-06 NOTE — Progress Notes (Signed)
Recreation Therapy Notes  Date: 08/06/16 Time: 0930 Location: [address] Dayroom  Group Topic: Stress Management  Goal Area(s) Addresses:  Patient will verbalize importance of using healthy stress management.  Patient will identify positive emotions associated with healthy stress management.   Intervention: Stress Management  Activity :  Guided Imagery.  LRT introduced the stress management technique of guided imagery.  LRT read a script to allow patients to go one a mental vacation.  Patients were to follow along as LRT read script to engage in the activity.  Education:  Stress Management, Discharge Planning.   Education Outcome: Acknowledges edcuation/In group clarification offered/Needs additional education  Clinical Observations/Feedback: Pt did not attend group.   Victorino Sparrow, LRT/CTRS         Ria Comment, Katalin Colledge A 08/06/2016 1:13 PM

## 2016-08-06 NOTE — Progress Notes (Signed)
D: Pt presents animated on approach. Pt anxious and fidgety during assessment. Pt denies withdrawal symptoms. Pt denies suicidal thoughts and verbally contracts for safety. Pt verbalized that she feels weak today after vomiting last night. Pt stated that she's not tolerating her bedtime meds well. Pt stated that she made MD aware of vomiting last night.  A: Medications reviewed with pt. Pt made aware of new med order of Remeron and Benadryl for sleep tonight. Verbal support provided. Pt encouraged to attend groups. 15 minute checks performed for safety. R: Pt concerned about possible discharge date. Writer encouraged pt to speak with Nira Conn CSW regarding discharge plans.

## 2016-08-07 MED ORDER — NICOTINE 21 MG/24HR TD PT24: 21.0000 mg | MEDICATED_PATCH | Freq: Every day | TRANSDERMAL | 0 refills | Status: DC

## 2016-08-07 MED ORDER — ONDANSETRON 4 MG PO TBDP: 4.0000 mg | ORAL_TABLET | Freq: Three times a day (TID) | ORAL | 0 refills | Status: DC | PRN

## 2016-08-07 MED ORDER — CITALOPRAM HYDROBROMIDE 10 MG PO TABS: 10.0000 mg | ORAL_TABLET | Freq: Every day | ORAL | 0 refills | Status: DC

## 2016-08-07 MED ORDER — LISINOPRIL 10 MG PO TABS: 10.0000 mg | ORAL_TABLET | Freq: Every day | ORAL | 0 refills | Status: DC

## 2016-08-07 MED ORDER — LISINOPRIL 10 MG PO TABS
10.0000 mg | ORAL_TABLET | Freq: Every day | ORAL | Status: DC
  Filled 2016-08-07 (×2): qty 7

## 2016-08-07 MED ORDER — LISINOPRIL 20 MG PO TABS
20.0000 mg | ORAL_TABLET | Freq: Once | ORAL | Status: AC
  Administered 2016-08-07: 20 mg via ORAL
  Filled 2016-08-07 (×2): qty 1

## 2016-08-07 MED ORDER — MIRTAZAPINE 15 MG PO TABS: 15.0000 mg | ORAL_TABLET | Freq: Every day | ORAL | 0 refills | Status: DC

## 2016-08-07 MED ORDER — HYDROXYZINE HCL 25 MG PO TABS: 25.0000 mg | ORAL_TABLET | Freq: Four times a day (QID) | ORAL | 0 refills | Status: DC | PRN

## 2016-08-07 NOTE — Progress Notes (Signed)
  [facility] Adult Case Management Discharge Plan :  Will you be returning to the same living situation after discharge:  No. Pt accepted to [facility] (per pt). CSW left message with admissions to confirm this.  At discharge, do you have transportation home?: Yes,  family or bus Do you have the ability to pay for your medications: Yes,  Mental health  Release of information consent forms completed and submitted to medical records by CSW.  Patient to Follow up at: Follow-up Information    [facility] Follow up.   Why:  Physicans Statement and Chest Xray faxed per request. Message left for [facility] in admissions. Please follow-up with admissions after discharge regarding potential admission.  Contact information: [address] [place], [place] [address] Phone: [phone] Fax; [phone]       [facility] Follow up on 08/11/2016.   Why:  Hospital follow-up on Monday at 8:30AM for hospital follow-up. Please bring the following: Photo ID, Social [employer], any proof of income or insurance card. Thank you.  Contact information: [address] [place]  [address] [phone]           Next level of care provider has access to [place] and Suicide Prevention discussed: Yes,  SPE completed with pt; contact attempts made with pt's father.  Have you used any form of tobacco in the last 30 days? (Cigarettes, Smokeless Tobacco, Cigars, and/or Pipes): Yes  Has patient been referred to the Quitline?: Patient refused referral  Patient has been referred for addiction treatment: Yes  Zamarian Scarano N Smart LCSW 08/07/2016, 10:37 AM

## 2016-08-07 NOTE — Discharge Summary (Signed)
Physician Discharge Summary Note  Patient:  Haley Olsen is an 26 y.o., female MRN:  [id number] DOB:  [birth year] Patient phone:  [phone] (home)  Patient address:   [place] [address],  Total Time spent with patient: Greater than 30 minutes  Date of Admission:  08/03/2016 Date of Discharge: 08-07-16  Reason for Admission: Suicidal ideations triggered by increased drug use.  Principal Problem: Opioid use disorder, severe, dependence [facility]) Discharge Diagnoses: Patient Active Problem List   Diagnosis Date Noted  . Opioid use disorder, severe, dependence ([place]) [F11.20] 08/04/2016  . Pain in joint, ankle and foot [M25.579] 11/12/2012  . Mechanical back pain [M54.9] 05/01/2011  . TOBACCO USER [F17.200] 03/03/2009  . ASTHMA, INTERMITTENT [J45.909] 07/23/2006   Past Psychiatric History: Polysubstance use disorder.  Past Medical History:  Past Medical History:  Diagnosis Date  . Asthma   . Chlamydia     Past Surgical History:  Procedure Laterality Date  . NO PAST SURGERIES     Family History:  Family History  Problem Relation Age of Onset  . Dementia Maternal Grandmother   . Heart disease Maternal Grandmother    Family Psychiatric  History: See H&P Social History:  History  Alcohol Use No     History  Drug Use  . Types: IV, Marijuana    Comment: heroine    Social History   Social History  . Marital status: Single    Spouse name: N/A  . Number of children: N/A  . Years of education: N/A   Social History Main Topics  . Smoking status: Current Every Day Smoker    Packs/day: 0.50    Types: Cigarettes    Last attempt to quit: 12/20/2010  . Smokeless tobacco: Never Used     Comment: declined  . Alcohol use No  . Drug use: Yes    Types: IV, Marijuana     Comment: heroine  . Sexual activity: Yes    Birth control/ protection: Condom, None   Other Topics Concern  . None   Social History Narrative  . None   Hospital Course:  This is an admission assessment with this 26 year old Caucasian female with hx of polysubstance dependence including opioid drugs. Admitted to the [facility] adult unit from the [facility] with complaints of excessive drug use triggering suicidal ideations with plans to cut her wrists. During this assessment, Haley Olsen reports, "I voluntarily checked myself in to the [facility] 2 days ago. I was feeling suicidal because I have been using drugs heavily x 1 month. I was sober x 1 year when I relapsed on drugs a month ago. The reason for my relapse was relationship break-up. Prior to this, I was going to an outpatient behavioral clinic in [place] for a couple of months for opioid addiction & I was put on Suboxen. But, I stopped taking Suboxen, then relapse. I have been depressed all my life. I have anxiety disorder, PTSD, Bipolar disorder & addictive personality disorder. I was diagnosed with Bipolar disorder 5 years ago. I easily will flip out on a drop os a heart. I have daily mood swings, anger issues. I was tried on a lot of medications for the Bipolar disorder but none of them worked. I took Sulphur 2 years ago. I'm here because I need help. Help me get cleaned. I'm interested in a long term substance abuse treatment after discharge".  Jeffery was admitted to the [facility] adult unit with her  UDS positive for opioid, Cocaine & THC. She did admit to having been using drugs & it has worsened. The chronic use of drugs led to her development of suicidal ideations due to the guilt of being a drug addict. She was also presenting with worsening symptoms of depression, possibly substance induced. She was in need of opioid detox as well as mood stabilization treatments.   After admission assessment/evaluation, Haley Olsen's presenting symptoms were identified. The medication regimen targeting those symptoms were discussed & initiated with her consent. She received Clonidine detoxification treatment  protocols to combat the withdrawal symptoms of opioid. She was also medicated & discharged on; Citalopram 10 mg for depression, Hydroxyzine 25 mg prn for anxiety, Mirtazapine 15 mg for depression/insomnia & Nicotine patch 21 mg for smoking cessation. She was also enrolled & participated in the group counseling sessions being offered & held on this unit. She learned coping skills that should help her further to cope better & manage her depression/substance abuse issues after discharge. Part of her discharge plans is a referral & placement to a long term substance abuse treatment center for further substance abuse treatment after discharge.  Byrd has completed detox treatment & her mood is now stable. This is evidenced by her reports of improved mood, absence of suicidal ideations & or substance withdrawal symptoms. She is currently being discharged to continue further substance abuse treatment at the [facility] in [place], [place]. She was provided with all the pertinent information needed to make this appointment without problems.  Upon discharge, Haley Olsen adamantly denies any SIHI, AVH, delusional thoughts, paranoia or substance withdrawal symptoms. She is provided with a 7 days worth, supply samples of her [facility] discharge medications. She left [facility] with all personal belongings in no apparent distress. Transportation per bus.   Physical Findings: AIMS: Facial and Oral Movements Muscles of Facial Expression: None, normal Lips and Perioral Area: None, normal Jaw: None, normal Tongue: None, normal,Extremity Movements Upper (arms, wrists, hands, fingers): None, normal Lower (legs, knees, ankles, toes): None, normal, Trunk Movements Neck, shoulders, hips: None, normal, Overall Severity Severity of abnormal movements (highest score from questions above): None, normal Incapacitation due to abnormal movements: None, normal Patient's awareness of abnormal movements (rate only patient's report): No  Awareness, Dental Status Current problems with teeth and/or dentures?: No Does patient usually wear dentures?: No  CIWA:    COWS:  COWS Total Score: 1  Musculoskeletal: Strength & Muscle Tone: within normal limits Gait & Station: normal Patient leans: N/A  Psychiatric Specialty Exam: Physical Exam  Constitutional: She is oriented to person, place, and time. She appears well-developed.  HENT:  Head: Normocephalic.  Eyes: Pupils are equal, round, and reactive to light.  Neck: Normal range of motion.  Cardiovascular:  Elevated blood pressure  Respiratory: Effort normal.  GI: Soft.  Genitourinary:  Genitourinary Comments: Deferred  Musculoskeletal: Normal range of motion.  Neurological: She is alert and oriented to person, place, and time.  Skin: Skin is warm and dry.    Review of Systems  Constitutional: Negative.   HENT: Negative.   Eyes: Negative.   Cardiovascular: Negative.   Gastrointestinal: Negative.   Genitourinary: Negative.   Musculoskeletal: Negative.  Back pain: Hx. Polysubstance dependence. Myalgias: ble.  Skin: Negative.   Neurological: Negative.   Endo/Heme/Allergies: Negative.   Psychiatric/Behavioral: Positive for depression and substance abuse. Negative for hallucinations, memory loss and suicidal ideas. The patient has insomnia ( Stable). The patient is not nervous/anxious.     Blood pressure (!) 157/103, pulse  65, temperature 98.5 F (36.9 C), temperature source Oral, resp. rate 18, height 5\' 4"  (1.626 m), weight 49.4 kg (109 lb), SpO2 99 %.Body mass index is 18.71 kg/m.  See Md's SRA   Have you used any form of tobacco in the last 30 days? (Cigarettes, Smokeless Tobacco, Cigars, and/or Pipes): Yes  Has this patient used any form of tobacco in the last 30 days? (Cigarettes, Smokeless Tobacco, Cigars, and/or Pipes): Yes, provided with a nicotine patch prescription.  Blood Alcohol level:  Lab Results  Component Value Date   ETH <5 82/50/5397    Metabolic Disorder Labs:  No results found for: HGBA1C, MPG No results found for: PROLACTIN Lab Results  Component Value Date   CHOL 144 08/05/2016   TRIG 101 08/05/2016   HDL 40 (L) 08/05/2016   CHOLHDL 3.6 08/05/2016   VLDL 20 08/05/2016   LDLCALC 84 08/05/2016   See Psychiatric Specialty Exam and Suicide Risk Assessment completed by Attending Physician prior to discharge.  Discharge destination:  Home  Is patient on multiple antipsychotic therapies at discharge:  No   Has Patient had three or more failed trials of antipsychotic monotherapy by history:  No  Recommended Plan for Multiple Antipsychotic Therapies: NA  Allergies as of 08/07/2016      Reactions   Wasp Venom Protein    Hydrocodone Hives      Medication List    TAKE these medications     Indication  citalopram 10 MG tablet Commonly known as:  CELEXA Take 1 tablet (10 mg total) by mouth daily. For depression Start taking on:  08/08/2016  Indication:  Depression   hydrOXYzine 25 MG tablet Commonly known as:  ATARAX/VISTARIL Take 1 tablet (25 mg total) by mouth every 6 (six) hours as needed for anxiety.  Indication:  Anxiety Neurosis   lisinopril 10 MG tablet Commonly known as:  PRINIVIL,ZESTRIL Take 1 tablet (10 mg total) by mouth daily. For high blood pressure  Indication:  High Blood Pressure Disorder   mirtazapine 15 MG tablet Commonly known as:  REMERON Take 1 tablet (15 mg total) by mouth at bedtime. For depression/sleep  Indication:  Major Depressive Disorder, Insomnia   nicotine 21 mg/24hr patch Commonly known as:  NICODERM CQ - dosed in mg/24 hours Place 1 patch (21 mg total) onto the skin daily. For smoking cessation Start taking on:  08/08/2016  Indication:  Nicotine Addiction   ondansetron 4 MG disintegrating tablet Commonly known as:  ZOFRAN-ODT Take 1 tablet (4 mg total) by mouth every 8 (eight) hours as needed for nausea or vomiting.  Indication:  Nausea, vomiting       Follow-up Information    [facility] Follow up.   Why:  Physicans Statement and Chest Xray faxed per request. Message left for [facility] in admissions. Please follow-up with admissions after discharge regarding potential admission.  Contact information: [address], [place] [address] Phone: [phone] Fax; [phone]       [facility] Follow up on 08/11/2016.   Why:  Hospital follow-up on Monday at 8:30AM for hospital follow-up. Please bring the following: Photo ID, Social [employer], any proof of income or insurance card. Thank you.  Contact information: [address] [place] [address] [phone]          Follow-up recommendations: Activity:  As tolerated Diet: As recommended by your primary care doctor. Keep all scheduled follow-up appointments as recommended.   Comments: Patient is instructed prior to discharge to: Take all medications  as prescribed by his/her mental healthcare provider. Report any adverse effects and or reactions from the medicines to his/her outpatient provider promptly. Patient has been instructed & cautioned: To not engage in alcohol and or illegal drug use while on prescription medicines. In the event of worsening symptoms, patient is instructed to call the crisis hotline, 911 and or go to the nearest ED for appropriate evaluation and treatment of symptoms. To follow-up with his/her primary care provider for your other medical issues, concerns and or health care needs  Signed: Encarnacion Slates, NP, PMHNP, FNP-BC 08/07/2016, 10:45 AM

## 2016-08-07 NOTE — Progress Notes (Signed)
Pt reports she has had a much better day.  She states she has had not nausea or withdrawal symptoms most of the day.  She hopes to discharge by Friday, but is concerned about her transportation.  Pt was encouraged to speak with her CSW about her concern.  She denies SI/HI/AVH.  She is hopeful for a better night sleep without vomiting.  Her sleep aid was changed for tonight.  Support and encouragement offered.  Discharge plans are in process.  Pt plans to go to [place] for further treatment.  Safety maintained with q15 minute checks.

## 2016-08-07 NOTE — Progress Notes (Signed)
CSW attempted several times to wake up patient this morning to discuss discharge and any potential concerns or obstacles. Pt continues to lay in bed; groggy/unwilling to get up to speak with CSW.  CSW faxed chest xray and physicians statement to [place] and made appt with [facility]. Pt left messages with pt's point of contact (father Loreley Schwall) [phone] to notify of discharge and for collateral information/to complete SPE.   Maxie Better, MSW, LCSW Clinical Social Worker 08/07/2016 10:36 AM

## 2016-08-07 NOTE — Tx Team (Signed)
Interdisciplinary Treatment and Diagnostic Plan Update  08/07/2016 Time of Session: [place] MRN: [id number]  Principal Diagnosis: Bipolar I Disorder, Current Episode Depressed, Severe Without Psychotic Features; Secondary Diagnoses: Principal Problem:   Opioid use disorder, severe, dependence ([place])   Current Medications:  Current Facility-Administered Medications  Medication Dose Route Frequency Provider Last Rate Last Dose  . acetaminophen (TYLENOL) tablet 650 mg  650 mg Oral Q6H PRN Rozetta Nunnery, NP      . alum & mag hydroxide-simeth (MAALOX/MYLANTA) 200-200-20 MG/5ML suspension 30 mL  30 mL Oral Q4H PRN Rozetta Nunnery, NP      . citalopram (CELEXA) tablet 10 mg  10 mg Oral Daily Norman Clay, MD   10 mg at 08/07/16 0841  . cloNIDine (CATAPRES) tablet 0.1 mg  0.1 mg Oral BH-qamhs Encarnacion Slates, NP   0.1 mg at 08/07/16 0841   Followed by  . [START ON 08/08/2016] cloNIDine (CATAPRES) tablet 0.1 mg  0.1 mg Oral QAC breakfast Encarnacion Slates, NP      . dicyclomine (BENTYL) tablet 20 mg  20 mg Oral Q6H PRN Encarnacion Slates, NP   20 mg at 08/04/16 2053  . diphenhydrAMINE (BENADRYL) capsule 25 mg  25 mg Oral QHS PRN Artist Beach, MD      . feeding supplement (ENSURE ENLIVE) (ENSURE ENLIVE) liquid 237 mL  237 mL Oral BID BM Myer Peer Cobos, MD   237 mL at 08/06/16 1500  . hydrOXYzine (ATARAX/VISTARIL) tablet 25 mg  25 mg Oral Q6H PRN Encarnacion Slates, NP   25 mg at 08/04/16 2053  . ibuprofen (ADVIL,MOTRIN) tablet 800 mg  800 mg Oral Q8H PRN Encarnacion Slates, NP   800 mg at 08/06/16 2149  . loperamide (IMODIUM) capsule 2-4 mg  2-4 mg Oral PRN Encarnacion Slates, NP      . magnesium hydroxide (MILK OF MAGNESIA) suspension 30 mL  30 mL Oral Daily PRN Rozetta Nunnery, NP      . methocarbamol (ROBAXIN) tablet 500 mg  500 mg Oral Q8H PRN Encarnacion Slates, NP   500 mg at 08/06/16 2149  . mirtazapine (REMERON) tablet 15 mg  15 mg Oral QHS Artist Beach, MD   15 mg at 08/06/16 2149  . nicotine  (NICODERM CQ - dosed in mg/24 hours) patch 21 mg  21 mg Transdermal Daily Jenne Campus, MD   21 mg at 08/07/16 0841  . ondansetron (ZOFRAN-ODT) disintegrating tablet 4 mg  4 mg Oral Q6H PRN Encarnacion Slates, NP   4 mg at 08/07/16 0130   PTA Medications: Prescriptions Prior to Admission  Medication Sig Dispense Refill Last Dose  . ondansetron (ZOFRAN-ODT) 4 MG disintegrating tablet Take 4 mg by mouth every 8 (eight) hours as needed for nausea or vomiting.   08/02/2016 at Unknown time    Patient Stressors: Financial difficulties Health problems Marital or family conflict Substance abuse Traumatic event  Patient Strengths: Average or above average intelligence Motivation for treatment/growth  Treatment Modalities: Medication Management, Group therapy, Case management,  1 to 1 session with clinician, Psychoeducation, Recreational therapy.   Physician Treatment Plan for Primary Diagnosis: Bipolar I Disorder, Current Episode Depressed, Severe Without Psychotic Features; Long Term Goal(s): Improvement in symptoms so as ready for discharge Improvement in symptoms so as ready for discharge   Short Term Goals: Ability to identify changes in lifestyle to reduce recurrence of condition will improve Ability to verbalize feelings will improve Ability  to disclose and discuss suicidal ideas Ability to identify changes in lifestyle to reduce recurrence of condition will improve Ability to demonstrate self-control will improve Ability to identify and develop effective coping behaviors will improve Compliance with prescribed medications will improve Ability to identify triggers associated with substance abuse/mental health issues will improve  Medication Management: Evaluate patient's response, side effects, and tolerance of medication regimen.  Therapeutic Interventions: 1 to 1 sessions, Unit Group sessions and Medication administration.  Evaluation of Outcomes: Met  Physician Treatment Plan  for Secondary Diagnosis: Principal Problem:   Opioid use disorder, severe, dependence ([place])  Long Term Goal(s): Improvement in symptoms so as ready for discharge Improvement in symptoms so as ready for discharge   Short Term Goals: Ability to identify changes in lifestyle to reduce recurrence of condition will improve Ability to verbalize feelings will improve Ability to disclose and discuss suicidal ideas Ability to identify changes in lifestyle to reduce recurrence of condition will improve Ability to demonstrate self-control will improve Ability to identify and develop effective coping behaviors will improve Compliance with prescribed medications will improve Ability to identify triggers associated with substance abuse/mental health issues will improve     Medication Management: Evaluate patient's response, side effects, and tolerance of medication regimen.  Therapeutic Interventions: 1 to 1 sessions, Unit Group sessions and Medication administration.  Evaluation of Outcomes: Met   RN Treatment Plan for Primary Diagnosis: Bipolar I Disorder, Current Episode Depressed, Severe Without Psychotic Features; Long Term Goal(s): Knowledge of disease and therapeutic regimen to maintain health will improve  Short Term Goals: Ability to disclose and discuss suicidal ideas and Ability to identify and develop effective coping behaviors will improve  Medication Management: RN will administer medications as ordered by provider, will assess and evaluate patient's response and provide education to patient for prescribed medication. RN will report any adverse and/or side effects to prescribing provider.  Therapeutic Interventions: 1 on 1 counseling sessions, Psychoeducation, Medication administration, Evaluate responses to treatment, Monitor vital signs and CBGs as ordered, Perform/monitor CIWA, COWS, AIMS and Fall Risk screenings as ordered, Perform wound care treatments as ordered.  Evaluation of  Outcomes: Met   LCSW Treatment Plan for Primary Diagnosis: Bipolar I Disorder, Current Episode Depressed, Severe Without Psychotic Features; Long Term Goal(s): Safe transition to appropriate next level of care at discharge, Engage patient in therapeutic group addressing interpersonal concerns.  Short Term Goals: Engage patient in aftercare planning with referrals and resources, Facilitate patient progression through stages of change regarding substance use diagnoses and concerns and Identify triggers associated with mental health/substance abuse issues  Therapeutic Interventions: Assess for all discharge needs, 1 to 1 time with Social worker, Explore available resources and support systems, Assess for adequacy in community support network, Educate family and significant other(s) on suicide prevention, Complete Psychosocial Assessment, Interpersonal group therapy.  Evaluation of Outcomes: Met   Progress in Treatment: Attending groups: Yes. Participating in groups: Yes. Taking medication as prescribed: Yes. Toleration medication: Yes. Family/Significant other contact made: Contact attempts made with pt's father. SPE completed with pt.  Patient understands diagnosis: Yes. Discussing patient identified problems/goals with staff: Yes. Medical problems stabilized or resolved: Yes. Denies suicidal/homicidal ideation: Yes, per self report.  Issues/concerns per patient self-inventory: No. Other: n/a  New problem(s) identified: No, Describe:  n/a  New Short Term/Long Term Goal(s): detox; medication stabilization; development of comprehensive mental wellness/sobriety plan.   Discharge Plan or Barriers: [facility] referral made-no beds available. Pt called [facility] and completed screening on her own.  CSW faxed physicians statement and chest xray this morning/left message for Juliann Pulse in admissions to confirm that pt was accepted. [facility] appt made for patient as well.   Reason for  Continuation of Hospitalization: none  Estimated Length of Stay: discharge today   Attendees: Patient: 08/07/2016 10:38 AM  Physician: Dr. Sanjuana Letters MD 08/07/2016 10:38 AM  Nursing: Duwaine Maxin RN 08/07/2016 10:38 AM  RN Care Manager: Lars Pinks CM 08/07/2016 10:38 AM  Social Worker: Press photographer, LCSW; Matthew Saras LCSWA  08/07/2016 10:38 AM  Recreational Therapist: Rhunette Croft 08/07/2016 10:38 AM  Other: Samuel Jester NP; Lindell Spar NP 08/07/2016 10:38 AM  Other:  08/07/2016 10:38 AM  Other: 08/07/2016 10:38 AM    Scribe for Treatment Team: [place], LCSW 08/07/2016 10:38 AM

## 2016-08-07 NOTE — Progress Notes (Signed)
Patient discharged home with aforementioned items and sample medications.  Discharged in stable condition; denies SI/HI/VH; she has bus pass for transport home.

## 2016-08-07 NOTE — Progress Notes (Signed)
Around 0130, pt was up again vomiting.  Pt stated that before she vomited, she had severe heartburn and acid reflux to the point that she had to vomit to get relief.  She kept saying, "It's the food, it's the food!".  Pt was given Zofran ODT after she went back to bed.  At this time, she has settled down and seems to be dozing off again.  Will inform next shift of possible GERD and need for possibly a proton pump inhibitor.  It seems she is fine during the day, but when she lays down at night, she has severe heartburn.  Will continue to monitor.

## 2016-08-07 NOTE — [facility] Suicide Risk Assessment (Signed)
Haley Olsen INPATIENT:  Family/Significant Other Suicide Prevention Education  Suicide Prevention Education:  Contact Attempts:  Lasheba Stevens (pt's father) [phone] has been identified by the patient as the family member/significant other with whom the patient will be residing, and identified as the person(s) who will aid the patient in the event of a mental health crisis.  With written consent from the patient, two attempts were made to provide suicide prevention education, prior to and/or following the patient's discharge.  We were unsuccessful in providing suicide prevention education.  A suicide education pamphlet was given to the patient to share with family/significant other.  Date and time of first attempt: 08/07/16 at 8:45AM Date and time of second attempt: 08/07/16 at 10:34AM (message left requesting cal back at his earliest convenience).   Tanaia Hawkey N Smart LCSW 08/07/2016, 10:34 AM

## 2016-08-07 NOTE — B[facility] Suicide Risk Assessment (Signed)
[facility] Discharge Suicide Risk Assessment   Principal Problem: Substance Use Disorder Discharge Diagnoses:  Patient Active Problem List   Diagnosis Date Noted  . Opioid use disorder, severe, dependence ([place]) [F11.20] 08/04/2016  . Pain in joint, ankle and foot [M25.579] 11/12/2012  . Mechanical back pain [M54.9] 05/01/2011  . TOBACCO USER [F17.200] 03/03/2009  . ASTHMA, INTERMITTENT [J45.909] 07/23/2006    Total Time spent with patient: 30 minutes  Musculoskeletal: Strength & Muscle Tone: within normal limits Gait & Station: normal Patient leans: N/A  Psychiatric Specialty Exam: Review of Systems  Constitutional: Negative.   HENT: Negative.   Eyes: Negative.   Respiratory: Negative.   Cardiovascular: Negative.   Gastrointestinal: Negative.   Genitourinary: Negative.   Musculoskeletal: Negative.   Skin: Negative.   Neurological: Negative.   Endo/Heme/Allergies: Negative.   Psychiatric/Behavioral: Negative for depression, hallucinations, memory loss and suicidal ideas. The patient is not nervous/anxious and does not have insomnia.     Blood pressure (!) 157/103, pulse 65, temperature 98.5 F (36.9 C), temperature source Oral, resp. rate 18, height 5\' 4"  (1.626 m), weight 49.4 kg (109 lb), SpO2 99 %.Body mass index is 18.71 kg/m.  General Appearance: Neatly dressed, pleasant, engaging well and cooperative. Appropriate behavior. Not in any distress. Good relatedness. Not internally stimulated  Eye Contact::  Good  Speech:  Spontaneous, normal prosody. Normal tone and rate.   Volume:  Normal  Mood:  Euthymic  Affect:  Appropriate and Full Range  Thought Process:  Goal Directed  Orientation:  Full (Time, Place, and Person)  Thought Content:  No delusional theme. No preoccupation with violent thoughts. No negative ruminations. No obsession.  No hallucination in any modality.   Suicidal Thoughts:  No  Homicidal Thoughts:  No  Memory:  Immediate;   Good Recent;   Good Remote;    Good  Judgement:  Good  Insight:  Good  Psychomotor Activity:  Normal  Concentration:  Good  Recall:  Good  Fund of Knowledge:Good  Language: Good  Akathisia:  No  Handed:    AIMS (if indicated):     Assets:  Communication Skills Desire for Improvement Resilience Vocational/Educational  Sleep:  Number of Hours: 5.75  Cognition: WNL  ADL's:  Intact   Clinical  Assessment::   26 yo Caucasian female with background history of chronic pain and SUD. Self presented on account of suicidal thoughts.  UDS was positive for cocaine THC and opiates.  Seen today. In good spirits. Pleased with disposition plan. Says she is not feeling depressed. She reports normal energy and interest. She reports normal sleep and good appetite. No suicidal thoughts. No thoughts of violence. No homicidal thoughts. No evidence of psychosis. No evidence of mania. No anxiety. Patient is able to think clearly. No cravings for substances.  Nursing staff reports that patient has been appropriate on the unit. Patient has been interacting well with peers. No behavioral issues. Patient has not voiced any suicidal thoughts. Patient has not been observed to be internally stimulated. Patient has been adherent with treatment recommendations. Patient has been tolerating their medication well.  Patient was discussed at team. Team members feels that patient is back to her baseline level of function. Team agrees with plan to discharge patient today.  Demographic Factors:  Caucasian  Loss Factors: NA  Historical Factors: Impulsivity  Risk Reduction Factors:   Living with another person, especially a relative, Positive social support, Positive therapeutic relationship and Positive coping skills or problem solving skills  Continued Clinical Symptoms:  As above  Cognitive Features That Contribute To Risk:  None    Suicide Risk:  Minimal: No identifiable suicidal ideation.   Patient is not having any thoughts of suicide  at this time. Modifiable risk factors targeted during this admission includes depression, chronic pain and substance use. Demographical and historical risk factors cannot be modified. Patient is now engaging well. Patient is reliable and is future oriented. We have buffered patient's support structures. At this point, patient is at low risk of suicide. Patient is aware of the effects of psychoactive substances on decision making process. Patient has been provided with emergency contacts. Patient acknowledges to use resources provided if unforseen circumstances changes their current risk stratification.   Follow-up Information    [facility] Follow up.   Why:  Physicans Statement and Chest Xray faxed per request. Message left for [facility] in admissions. Please follow-up with admissions after discharge regarding potential admission.  Contact information: [address], [place] [address] Phone: [phone] Fax; [phone]       [facility] Follow up on 08/11/2016.   Why:  Hospital follow-up on Monday at 8:30AM for hospital follow-up. Please bring the following: Photo ID, Social [employer], any proof of income or insurance card. Thank you.  Contact information: [address] [place] [phone]           Plan Of Care/Follow-up recommendations:  1. Continue current psychotropic medications 2. Mental health and addiction follow up as arranged.     Artist [place], MD 08/07/2016, 10:41 AM

## 2016-08-07 NOTE — Progress Notes (Signed)
Written/verbal discharge instructions, AVS, SRA, Transition Report,  Prescriptions, Bus Pass and follow-up appointments given to patient with verbalization of understanding;  Patient denies suicidal and homicidal ideation. Suicide Prevention information/materials given to patient. Awaiting sample medications from pharmacy.

## 2018-11-22 LAB — CBC AND DIFFERENTIAL
HCT: 36 (ref 36–46)
Hemoglobin: 12.3 (ref 12.0–16.0)
Neutrophils Absolute: 7
Platelets: 361 (ref 150–399)
WBC: 9.8

## 2018-11-28 LAB — HEPATIC FUNCTION PANEL
ALT: 34 (ref 7–35)
AST: 36 — AB (ref 13–35)
Alkaline Phosphatase: 69 (ref 25–125)
Bilirubin, Total: 0.5

## 2018-11-28 LAB — BASIC METABOLIC PANEL
BUN: 15 (ref 4–21)
Creatinine: 0.9 (ref 0.5–1.1)
Glucose: 99
Potassium: 5.2 (ref 3.4–5.3)
Sodium: 140 (ref 137–147)

## 2018-12-08 ENCOUNTER — Encounter: Payer: Self-pay | Admitting: Family Medicine

## 2018-12-08 ENCOUNTER — Other Ambulatory Visit: Payer: Self-pay

## 2018-12-08 ENCOUNTER — Ambulatory Visit: Payer: Self-pay | Admitting: Family Medicine

## 2018-12-08 VITALS — BP 158/108 | HR 104 | Temp 98.8°F | Resp 18 | Ht 64.0 in | Wt 128.5 lb

## 2018-12-08 DIAGNOSIS — F1911 Other psychoactive substance abuse, in remission: Secondary | ICD-10-CM

## 2018-12-08 DIAGNOSIS — E119 Type 2 diabetes mellitus without complications: Secondary | ICD-10-CM

## 2018-12-08 DIAGNOSIS — F319 Bipolar disorder, unspecified: Secondary | ICD-10-CM

## 2018-12-08 DIAGNOSIS — F1111 Opioid abuse, in remission: Secondary | ICD-10-CM | POA: Insufficient documentation

## 2018-12-08 DIAGNOSIS — I639 Cerebral infarction, unspecified: Secondary | ICD-10-CM

## 2018-12-08 DIAGNOSIS — R569 Unspecified convulsions: Secondary | ICD-10-CM

## 2018-12-08 DIAGNOSIS — F339 Major depressive disorder, recurrent, unspecified: Secondary | ICD-10-CM

## 2018-12-08 DIAGNOSIS — R030 Elevated blood-pressure reading, without diagnosis of hypertension: Secondary | ICD-10-CM

## 2018-12-08 DIAGNOSIS — B1710 Acute hepatitis C without hepatic coma: Secondary | ICD-10-CM

## 2018-12-08 DIAGNOSIS — B1920 Unspecified viral hepatitis C without hepatic coma: Secondary | ICD-10-CM | POA: Insufficient documentation

## 2018-12-08 NOTE — Assessment & Plan Note (Signed)
With lingering symptoms - word finding, thinking, numbness. Will get records, but also refer to neurology.

## 2018-12-08 NOTE — Patient Instructions (Signed)
#   Nerve loss - referral to neurology - our referral coordinator will call you  #Depression/anxiety - referral to psychiatry to help with management of depression/anxiety  Great job staying off drugs! Continue to this. If you are able to avoid IV drug use we can talk about treatment for hepatitis C  Referral to speech for therapy following your stroke

## 2018-12-08 NOTE — Assessment & Plan Note (Signed)
Pt notes diet controlled. Unable to find hgbA1c in our system. Will get outside records and plan for repeat in the future.

## 2018-12-08 NOTE — Assessment & Plan Note (Signed)
Is now 1 month out of IV drug use. Will get records, is interested in treatment. Given IV drug use and recent remission will work on stable remission and treatment of neurological/psych issues and continue to follow and address treatment once stable

## 2018-12-08 NOTE — Assessment & Plan Note (Signed)
Complete mood disorder hx. Discussed that due to bipolar would advise consultation and treatment through psychiatry. Is interested in treatment at this time. Referral placed

## 2018-12-08 NOTE — Assessment & Plan Note (Signed)
Pt reports using one time in the last several months due to being in [place] for >100 days. She used prior to her recent hospitalization and has not used since. Denies interest in returning to use. Hx of IV opiate use.

## 2018-12-08 NOTE — Progress Notes (Signed)
Subjective:     Haley Olsen is a 28 y.o. female presenting for Establish Care (no previous PCP. ) and Numbness (no sensation in the back of right leg, lower left leg. skin gets raw sensation.)     HPI  #hospitalization - was found down - hospitalized for 13 days  - MRI/CT scan which showed strokes - had recently gotten out of jail 11 days - used 1 time and then was found down - fentanyl in the system - first time for seizure/stroke - in [place] - but moved up here - no known hx of seizures - has noticed some difficulty coming up with words - also a hard time even witting her name - feels "reset" like she needs to learn everything again  #Numbness - only since the stroke - Right posterior buttock and back of thigh - and left shin - Does have a lot of pain in the buttock - was told it may be neuropathy -   #bipolar/depression/anxiety - has issues with insomnia - does not feel her bipolar is "that bad" - does not want therapy - is interested in treatment - was on celexa in the past as well as benzos - is feeling anxious today  Review of Systems See HPI  Social History   Tobacco Use  Smoking Status Current Every Day Smoker  . Packs/day: 0.25  . Years: 12.00  . Pack years: 3.00  . Types: Cigarettes  Smokeless Tobacco Never Used  Tobacco Comment   declined        Objective:    BP Readings from Last 3 Encounters:  12/08/18 (!) 158/108  08/03/16 104/60  08/04/14 136/72   Wt Readings from Last 3 Encounters:  12/08/18 128 lb 8 oz (58.3 kg)  08/03/16 110 lb (49.9 kg)  08/04/14 110 lb (49.9 kg)    BP (!) 158/108   Pulse (!) 104   Temp 98.8 F (37.1 C)   Resp 18   Ht 5\' 4"  (1.626 m)   Wt 128 lb 8 oz (58.3 kg)   LMP 10/08/2018   SpO2 98%   BMI 22.06 kg/m    Physical Exam Constitutional:      Appearance: She is normal weight. She is not ill-appearing.     Comments: Sitting in wheel chair, fidgety   HENT:     Head:  Normocephalic and atraumatic.     Right Ear: External ear normal.     Left Ear: External ear normal.     Nose: Nose normal.  Eyes:     General: No scleral icterus.    Conjunctiva/sclera: Conjunctivae normal.  Neck:     Musculoskeletal: Normal range of motion.  Cardiovascular:     Rate and Rhythm: Normal rate and regular rhythm.     Heart sounds: No murmur.  Pulmonary:     Effort: Pulmonary effort is normal. No respiratory distress.     Breath sounds: Normal breath sounds. No wheezing.  Neurological:     Mental Status: She is alert.     Comments: No sensation to light touch on the left lower shin  Psychiatric:        Attention and Perception: Attention normal.        Mood and Affect: Mood is anxious.        Behavior: Behavior is cooperative.        Thought Content: Thought content normal.     Comments: Speech - loses train of thought, occasionally repetitive  Assessment & Plan:   Problem List Items Addressed This Visit      Cardiovascular and Mediastinum   Cerebrovascular accident (CVA) ([place])    With lingering symptoms - word finding, thinking, numbness. Will get records, but also refer to neurology.       Relevant Orders   Ambulatory referral to Neurology   Ambulatory referral to Speech Therapy     Digestive   Hepatitis C    Is now 1 month out of IV drug use. Will get records, is interested in treatment. Given IV drug use and recent remission will work on stable remission and treatment of neurological/psych issues and continue to follow and address treatment once stable        Endocrine   Type 2 diabetes mellitus without complication, without long-term current use of insulin ([place])    Pt notes diet controlled. Unable to find hgbA1c in our system. Will get outside records and plan for repeat in the future.         Other   Seizures ([place])    Will obtain records. Denies hx of alcohol abuse or alcohol use (though at one point in the interview said she was  drinking with her boyfriend). Per report and brief review of the paperwork she brought with her she had a seizure. Unclear if drug/alcohol related. Will obtain records and referral placed to neurology for further management.       Relevant Medications   levETIRAcetam (KEPPRA) 250 MG tablet   Other Relevant Orders   Ambulatory referral to Neurology   Drug abuse in remission [facility]) - Primary    Pt reports using one time in the last several months due to being in [place] for >100 days. She used prior to her recent hospitalization and has not used since. Denies interest in returning to use. Hx of IV opiate use.       Depression, recurrent ([place])    Complete mood disorder hx. Discussed that due to bipolar would advise consultation and treatment through psychiatry. Is interested in treatment at this time. Referral placed      Relevant Orders   Ambulatory referral to Psychiatry   Bipolar disorder [facility])   Relevant Orders   Ambulatory referral to Psychiatry   Elevated blood pressure reading    BP elevated today. Suspect anxiety. Will continue to monitor.           Return in about 4 months (around 04/10/2019).  Lesleigh Noe, MD

## 2018-12-08 NOTE — Assessment & Plan Note (Signed)
Will obtain records. Denies hx of alcohol abuse or alcohol use (though at one point in the interview said she was drinking with her boyfriend). Per report and brief review of the paperwork she brought with her she had a seizure. Unclear if drug/alcohol related. Will obtain records and referral placed to neurology for further management.

## 2018-12-08 NOTE — Assessment & Plan Note (Signed)
BP elevated today. Suspect anxiety. Will continue to monitor.

## 2018-12-10 ENCOUNTER — Telehealth: Payer: Self-pay | Admitting: Family Medicine

## 2018-12-10 DIAGNOSIS — I639 Cerebral infarction, unspecified: Secondary | ICD-10-CM

## 2018-12-10 NOTE — Telephone Encounter (Signed)
Spoke w/father.  Pt has no insurance.  They are trying to get MCD.  He will call Monday to give Anastasiya the name and phone number of Home Health agency he spoke with.

## 2018-12-10 NOTE — Telephone Encounter (Signed)
Referral placed. Harrell Gave, father, advised and referral order was put in and we will work on this.

## 2018-12-10 NOTE — Telephone Encounter (Signed)
Unable to place referral due to computer access. This is appropriate and agree with home health physical therapy. Was the recommendation from hospitalization.   Home health due to recent stroke.

## 2018-12-10 NOTE — Telephone Encounter (Signed)
Patient's father called today requesting a referral for the patient. He stated in order for her to receive physical therapy and everything the patient needs they would need this sent to [facility] in [place]   Please advise

## 2018-12-12 ENCOUNTER — Emergency Department (HOSPITAL_COMMUNITY): Payer: Self-pay

## 2018-12-12 ENCOUNTER — Inpatient Hospital Stay (HOSPITAL_COMMUNITY)
Admission: EM | Admit: 2018-12-12 | Discharge: 2018-12-16 | DRG: 896 | Disposition: A | Discharge status: Home / Self Care | Payer: Self-pay | Attending: Internal Medicine | Admitting: Internal Medicine

## 2018-12-12 ENCOUNTER — Other Ambulatory Visit: Payer: Self-pay

## 2018-12-12 ENCOUNTER — Encounter (HOSPITAL_COMMUNITY): Payer: Self-pay | Admitting: *Deleted

## 2018-12-12 ENCOUNTER — Emergency Department (HOSPITAL_COMMUNITY)
Admission: EM | Admit: 2018-12-12 | Discharge: 2018-12-12 | Disposition: A | Discharge status: Home / Self Care | Payer: Self-pay | Attending: Emergency Medicine | Admitting: Emergency Medicine

## 2018-12-12 DIAGNOSIS — Z8673 Personal history of transient ischemic attack (TIA), and cerebral infarction without residual deficits: Secondary | ICD-10-CM

## 2018-12-12 DIAGNOSIS — Z79899 Other long term (current) drug therapy: Secondary | ICD-10-CM

## 2018-12-12 DIAGNOSIS — M79671 Pain in right foot: Secondary | ICD-10-CM | POA: Diagnosis present

## 2018-12-12 DIAGNOSIS — Z833 Family history of diabetes mellitus: Secondary | ICD-10-CM

## 2018-12-12 DIAGNOSIS — F339 Major depressive disorder, recurrent, unspecified: Secondary | ICD-10-CM

## 2018-12-12 DIAGNOSIS — F1120 Opioid dependence, uncomplicated: Secondary | ICD-10-CM | POA: Diagnosis present

## 2018-12-12 DIAGNOSIS — F1290 Cannabis use, unspecified, uncomplicated: Secondary | ICD-10-CM | POA: Diagnosis present

## 2018-12-12 DIAGNOSIS — F419 Anxiety disorder, unspecified: Secondary | ICD-10-CM | POA: Diagnosis present

## 2018-12-12 DIAGNOSIS — F17200 Nicotine dependence, unspecified, uncomplicated: Secondary | ICD-10-CM

## 2018-12-12 DIAGNOSIS — R410 Disorientation, unspecified: Secondary | ICD-10-CM

## 2018-12-12 DIAGNOSIS — F17210 Nicotine dependence, cigarettes, uncomplicated: Secondary | ICD-10-CM | POA: Diagnosis present

## 2018-12-12 DIAGNOSIS — G4700 Insomnia, unspecified: Secondary | ICD-10-CM | POA: Diagnosis present

## 2018-12-12 DIAGNOSIS — E119 Type 2 diabetes mellitus without complications: Secondary | ICD-10-CM | POA: Diagnosis present

## 2018-12-12 DIAGNOSIS — F1924 Other psychoactive substance dependence with psychoactive substance-induced mood disorder: Secondary | ICD-10-CM | POA: Diagnosis present

## 2018-12-12 DIAGNOSIS — D649 Anemia, unspecified: Secondary | ICD-10-CM | POA: Diagnosis present

## 2018-12-12 DIAGNOSIS — I639 Cerebral infarction, unspecified: Secondary | ICD-10-CM | POA: Diagnosis present

## 2018-12-12 DIAGNOSIS — Z885 Allergy status to narcotic agent status: Secondary | ICD-10-CM

## 2018-12-12 DIAGNOSIS — R9431 Abnormal electrocardiogram [ECG] [EKG]: Secondary | ICD-10-CM | POA: Diagnosis present

## 2018-12-12 DIAGNOSIS — R4182 Altered mental status, unspecified: Secondary | ICD-10-CM | POA: Diagnosis present

## 2018-12-12 DIAGNOSIS — J45909 Unspecified asthma, uncomplicated: Secondary | ICD-10-CM | POA: Diagnosis present

## 2018-12-12 DIAGNOSIS — K219 Gastro-esophageal reflux disease without esophagitis: Secondary | ICD-10-CM | POA: Diagnosis present

## 2018-12-12 DIAGNOSIS — G8929 Other chronic pain: Secondary | ICD-10-CM | POA: Diagnosis present

## 2018-12-12 DIAGNOSIS — F319 Bipolar disorder, unspecified: Secondary | ICD-10-CM | POA: Diagnosis present

## 2018-12-12 DIAGNOSIS — Z1159 Encounter for screening for other viral diseases: Secondary | ICD-10-CM

## 2018-12-12 DIAGNOSIS — B1920 Unspecified viral hepatitis C without hepatic coma: Secondary | ICD-10-CM | POA: Diagnosis present

## 2018-12-12 DIAGNOSIS — E876 Hypokalemia: Secondary | ICD-10-CM | POA: Diagnosis not present

## 2018-12-12 DIAGNOSIS — Z818 Family history of other mental and behavioral disorders: Secondary | ICD-10-CM

## 2018-12-12 DIAGNOSIS — Z825 Family history of asthma and other chronic lower respiratory diseases: Secondary | ICD-10-CM

## 2018-12-12 DIAGNOSIS — F1994 Other psychoactive substance use, unspecified with psychoactive substance-induced mood disorder: Secondary | ICD-10-CM

## 2018-12-12 DIAGNOSIS — Z9119 Patient's noncompliance with other medical treatment and regimen: Secondary | ICD-10-CM

## 2018-12-12 DIAGNOSIS — B1710 Acute hepatitis C without hepatic coma: Secondary | ICD-10-CM

## 2018-12-12 DIAGNOSIS — Z5321 Procedure and treatment not carried out due to patient leaving prior to being seen by health care provider: Secondary | ICD-10-CM | POA: Insufficient documentation

## 2018-12-12 DIAGNOSIS — Z813 Family history of other psychoactive substance abuse and dependence: Secondary | ICD-10-CM

## 2018-12-12 DIAGNOSIS — R251 Tremor, unspecified: Secondary | ICD-10-CM | POA: Insufficient documentation

## 2018-12-12 DIAGNOSIS — F1111 Opioid abuse, in remission: Secondary | ICD-10-CM | POA: Diagnosis present

## 2018-12-12 DIAGNOSIS — F1911 Other psychoactive substance abuse, in remission: Secondary | ICD-10-CM

## 2018-12-12 DIAGNOSIS — G92 Toxic encephalopathy: Secondary | ICD-10-CM | POA: Diagnosis present

## 2018-12-12 DIAGNOSIS — Z91030 Bee allergy status: Secondary | ICD-10-CM

## 2018-12-12 DIAGNOSIS — G9340 Encephalopathy, unspecified: Secondary | ICD-10-CM

## 2018-12-12 DIAGNOSIS — G40909 Epilepsy, unspecified, not intractable, without status epilepticus: Secondary | ICD-10-CM | POA: Diagnosis present

## 2018-12-12 DIAGNOSIS — R400 Somnolence: Secondary | ICD-10-CM

## 2018-12-12 DIAGNOSIS — E872 Acidosis: Secondary | ICD-10-CM | POA: Diagnosis present

## 2018-12-12 DIAGNOSIS — F19221 Other psychoactive substance dependence with intoxication delirium: Principal | ICD-10-CM | POA: Diagnosis present

## 2018-12-12 LAB — URINALYSIS, ROUTINE W REFLEX MICROSCOPIC
Bilirubin Urine: NEGATIVE
Glucose, UA: NEGATIVE mg/dL
Ketones, ur: 80 mg/dL — AB
Leukocytes,Ua: NEGATIVE
Nitrite: NEGATIVE
Protein, ur: 100 mg/dL — AB
Specific Gravity, Urine: 1.021 (ref 1.005–1.030)
pH: 5 (ref 5.0–8.0)

## 2018-12-12 LAB — CBC WITH DIFFERENTIAL/PLATELET
Abs Immature Granulocytes: 0.05 10*3/uL (ref 0.00–0.07)
Basophils Absolute: 0 10*3/uL (ref 0.0–0.1)
Basophils Relative: 0 %
Eosinophils Absolute: 0 10*3/uL (ref 0.0–0.5)
Eosinophils Relative: 0 %
HCT: 39.7 % (ref 36.0–46.0)
Hemoglobin: 13.4 g/dL (ref 12.0–15.0)
Immature Granulocytes: 0 %
Lymphocytes Relative: 19 %
Lymphs Abs: 2.4 10*3/uL (ref 0.7–4.0)
MCH: 31 pg (ref 26.0–34.0)
MCHC: 33.8 g/dL (ref 30.0–36.0)
MCV: 91.9 fL (ref 80.0–100.0)
Monocytes Absolute: 0.9 10*3/uL (ref 0.1–1.0)
Monocytes Relative: 7 %
Neutro Abs: 9.4 10*3/uL — ABNORMAL HIGH (ref 1.7–7.7)
Neutrophils Relative %: 74 %
Platelets: 198 10*3/uL (ref 150–400)
RBC: 4.32 MIL/uL (ref 3.87–5.11)
RDW: 13 % (ref 11.5–15.5)
WBC: 12.7 10*3/uL — ABNORMAL HIGH (ref 4.0–10.5)
nRBC: 0 % (ref 0.0–0.2)

## 2018-12-12 LAB — COMPREHENSIVE METABOLIC PANEL
ALT: 39 U/L (ref 0–44)
AST: 49 U/L — ABNORMAL HIGH (ref 15–41)
Albumin: 5.1 g/dL — ABNORMAL HIGH (ref 3.5–5.0)
Alkaline Phosphatase: 80 U/L (ref 38–126)
Anion gap: 21 — ABNORMAL HIGH (ref 5–15)
BUN: 23 mg/dL — ABNORMAL HIGH (ref 6–20)
CO2: 13 mmol/L — ABNORMAL LOW (ref 22–32)
Calcium: 9.8 mg/dL (ref 8.9–10.3)
Chloride: 102 mmol/L (ref 98–111)
Creatinine, Ser: 1 mg/dL (ref 0.44–1.00)
GFR calc Af Amer: >60 mL/min (ref >60)
GFR calc non Af Amer: >60 mL/min (ref >60)
Glucose, Bld: 82 mg/dL (ref 70–99)
Potassium: 2.9 mmol/L — ABNORMAL LOW (ref 3.5–5.1)
Sodium: 136 mmol/L (ref 135–145)
Total Bilirubin: 1.4 mg/dL — ABNORMAL HIGH (ref 0.3–1.2)
Total Protein: 8.6 g/dL — ABNORMAL HIGH (ref 6.5–8.1)

## 2018-12-12 LAB — LACTIC ACID, PLASMA
Lactic Acid, Venous: 0.9 mmol/L (ref 0.5–1.9)
Lactic Acid, Venous: 1 mmol/L (ref 0.5–1.9)

## 2018-12-12 LAB — BASIC METABOLIC PANEL
Anion gap: 14 (ref 5–15)
BUN: 14 mg/dL (ref 6–20)
CO2: 14 mmol/L — ABNORMAL LOW (ref 22–32)
Calcium: 8.5 mg/dL — ABNORMAL LOW (ref 8.9–10.3)
Chloride: 110 mmol/L (ref 98–111)
Creatinine, Ser: 0.85 mg/dL (ref 0.44–1.00)
GFR calc Af Amer: >60 mL/min (ref >60)
GFR calc non Af Amer: >60 mL/min (ref >60)
Glucose, Bld: 102 mg/dL — ABNORMAL HIGH (ref 70–99)
Potassium: 4 mmol/L (ref 3.5–5.1)
Sodium: 138 mmol/L (ref 135–145)

## 2018-12-12 LAB — RAPID URINE DRUG SCREEN, HOSP PERFORMED
Amphetamines: POSITIVE — AB
Barbiturates: NOT DETECTED
Benzodiazepines: POSITIVE — AB
Cocaine: NOT DETECTED
Opiates: NOT DETECTED
Tetrahydrocannabinol: POSITIVE — AB

## 2018-12-12 LAB — BRAIN NATRIURETIC PEPTIDE: B Natriuretic Peptide: 36.9 pg/mL (ref 0.0–100.0)

## 2018-12-12 LAB — ACETAMINOPHEN LEVEL: Acetaminophen (Tylenol), Serum: <10 ug/mL — ABNORMAL LOW (ref 10–30)

## 2018-12-12 LAB — GLUCOSE, CAPILLARY
Glucose-Capillary: 57 mg/dL — ABNORMAL LOW (ref 70–99)
Glucose-Capillary: 64 mg/dL — ABNORMAL LOW (ref 70–99)
Glucose-Capillary: 96 mg/dL (ref 70–99)

## 2018-12-12 LAB — HEMOGLOBIN A1C
Hgb A1c MFr Bld: 5.1 % (ref 4.8–5.6)
Mean Plasma Glucose: 99.67 mg/dL

## 2018-12-12 LAB — SARS CORONAVIRUS 2 BY RT PCR (HOSPITAL ORDER, PERFORMED IN ~~LOC~~ HOSPITAL LAB): SARS Coronavirus 2: NEGATIVE

## 2018-12-12 LAB — CALCIUM: Calcium: 8.6 mg/dL — ABNORMAL LOW (ref 8.9–10.3)

## 2018-12-12 LAB — CK: Total CK: 132 U/L (ref 38–234)

## 2018-12-12 LAB — PHOSPHORUS: Phosphorus: 3.7 mg/dL (ref 2.5–4.6)

## 2018-12-12 LAB — ETHANOL: Alcohol, Ethyl (B): <10 mg/dL (ref <10)

## 2018-12-12 LAB — MAGNESIUM: Magnesium: 3.3 mg/dL — ABNORMAL HIGH (ref 1.7–2.4)

## 2018-12-12 LAB — TSH: TSH: 0.286 u[IU]/mL — ABNORMAL LOW (ref 0.350–4.500)

## 2018-12-12 LAB — LITHIUM LEVEL: Lithium Lvl: <0.06 mmol/L — ABNORMAL LOW (ref 0.60–1.20)

## 2018-12-12 LAB — AMMONIA: Ammonia: 26 umol/L (ref 9–35)

## 2018-12-12 MED ORDER — SORBITOL 70 % SOLN
30.0000 mL | Freq: Every day | Status: DC | PRN
  Filled 2018-12-12: qty 30

## 2018-12-12 MED ORDER — SODIUM CHLORIDE 0.9 % IV BOLUS
1000.0000 mL | Freq: Once | INTRAVENOUS | Status: AC
  Administered 2018-12-12: 1000 mL via INTRAVENOUS

## 2018-12-12 MED ORDER — POTASSIUM CHLORIDE 10 MEQ/100ML IV SOLN
10.0000 meq | Freq: Once | INTRAVENOUS | Status: AC
  Administered 2018-12-12: 10 meq via INTRAVENOUS

## 2018-12-12 MED ORDER — ALBUTEROL SULFATE (2.5 MG/3ML) 0.083% IN NEBU: 2.5000 mg | INHALATION_SOLUTION | RESPIRATORY_TRACT | Status: DC | PRN

## 2018-12-12 MED ORDER — PYRIDOXINE HCL 100 MG/ML IJ SOLN: 100.0000 mg | Freq: Every day | INTRAMUSCULAR | Status: DC

## 2018-12-12 MED ORDER — DEXTROSE 50 % IV SOLN
12.5000 g | INTRAVENOUS | Status: AC
  Administered 2018-12-12: 12.5 g via INTRAVENOUS
  Filled 2018-12-12: qty 50

## 2018-12-12 MED ORDER — HEPARIN SODIUM (PORCINE) 5000 UNIT/ML IJ SOLN
5000.0000 [IU] | Freq: Three times a day (TID) | INTRAMUSCULAR | Status: DC
  Administered 2018-12-13 (×2): 5000 [IU] via SUBCUTANEOUS
  Filled 2018-12-12 (×5): qty 1

## 2018-12-12 MED ORDER — KETOROLAC TROMETHAMINE 15 MG/ML IJ SOLN
15.0000 mg | Freq: Four times a day (QID) | INTRAMUSCULAR | Status: DC | PRN
  Administered 2018-12-12 – 2018-12-16 (×11): 15 mg via INTRAVENOUS
  Filled 2018-12-12 (×12): qty 1

## 2018-12-12 MED ORDER — INSULIN ASPART 100 UNIT/ML ~~LOC~~ SOLN
0.0000 [IU] | SUBCUTANEOUS | Status: DC
  Filled 2018-12-12: qty 0.09

## 2018-12-12 MED ORDER — HALOPERIDOL LACTATE 5 MG/ML IJ SOLN: 2.0000 mg | Freq: Four times a day (QID) | INTRAMUSCULAR | Status: DC | PRN

## 2018-12-12 MED ORDER — ONDANSETRON HCL 4 MG/2ML IJ SOLN: 4.0000 mg | Freq: Four times a day (QID) | INTRAMUSCULAR | Status: DC | PRN

## 2018-12-12 MED ORDER — ACETAMINOPHEN 325 MG PO TABS
650.0000 mg | ORAL_TABLET | Freq: Four times a day (QID) | ORAL | Status: DC | PRN
  Administered 2018-12-13 – 2018-12-16 (×4): 650 mg via ORAL
  Filled 2018-12-12 (×4): qty 2

## 2018-12-12 MED ORDER — DEXTROSE-NACL 5-0.45 % IV SOLN: INTRAVENOUS | Status: DC

## 2018-12-12 MED ORDER — ACETAMINOPHEN 650 MG RE SUPP: 650.0000 mg | Freq: Four times a day (QID) | RECTAL | Status: DC | PRN

## 2018-12-12 MED ORDER — THIAMINE HCL 100 MG/ML IJ SOLN
100.0000 mg | Freq: Every day | INTRAMUSCULAR | Status: DC
  Administered 2018-12-12 – 2018-12-16 (×5): 100 mg via INTRAVENOUS
  Filled 2018-12-12 (×5): qty 2

## 2018-12-12 MED ORDER — SENNOSIDES-DOCUSATE SODIUM 8.6-50 MG PO TABS: 1.0000 | ORAL_TABLET | Freq: Every evening | ORAL | Status: DC | PRN

## 2018-12-12 MED ORDER — POTASSIUM CHLORIDE 10 MEQ/100ML IV SOLN
10.0000 meq | INTRAVENOUS | Status: AC
  Administered 2018-12-12 (×2): 10 meq via INTRAVENOUS
  Filled 2018-12-12 (×2): qty 100

## 2018-12-12 MED ORDER — STERILE WATER FOR INJECTION IJ SOLN
INTRAMUSCULAR | Status: AC
  Administered 2018-12-12: 11:00:00 1.2 mL
  Filled 2018-12-12: qty 10

## 2018-12-12 MED ORDER — ZIPRASIDONE MESYLATE 20 MG IM SOLR: 20.0000 mg | Freq: Once | INTRAMUSCULAR | Status: DC

## 2018-12-12 MED ORDER — ZIPRASIDONE MESYLATE 20 MG IM SOLR
10.0000 mg | Freq: Once | INTRAMUSCULAR | Status: AC
  Administered 2018-12-12: 11:00:00 10 mg via INTRAMUSCULAR
  Filled 2018-12-12: qty 20

## 2018-12-12 MED ORDER — ADULT MULTIVITAMIN W/MINERALS CH
1.0000 | ORAL_TABLET | Freq: Every day | ORAL | Status: DC
  Administered 2018-12-13 – 2018-12-15 (×3): 1 via ORAL
  Filled 2018-12-12 (×5): qty 1

## 2018-12-12 MED ORDER — TRAZODONE HCL 50 MG PO TABS: 50.0000 mg | ORAL_TABLET | Freq: Every evening | ORAL | Status: DC | PRN

## 2018-12-12 MED ORDER — DOCUSATE SODIUM 100 MG PO CAPS
100.0000 mg | ORAL_CAPSULE | Freq: Two times a day (BID) | ORAL | Status: DC
  Administered 2018-12-13 – 2018-12-15 (×4): 100 mg via ORAL
  Filled 2018-12-12 (×7): qty 1

## 2018-12-12 MED ORDER — SODIUM CHLORIDE 0.9 % IV SOLN
INTRAVENOUS | Status: DC
  Administered 2018-12-12 – 2018-12-16 (×7): via INTRAVENOUS

## 2018-12-12 MED ORDER — SODIUM CHLORIDE 0.9% FLUSH
3.0000 mL | Freq: Two times a day (BID) | INTRAVENOUS | Status: DC
  Administered 2018-12-12 – 2018-12-15 (×6): 3 mL via INTRAVENOUS

## 2018-12-12 MED ORDER — MAGNESIUM CITRATE PO SOLN: 1.0000 | Freq: Once | ORAL | Status: DC | PRN

## 2018-12-12 MED ORDER — SODIUM CHLORIDE 0.9 % IV SOLN
100.0000 mg | Freq: Every day | INTRAVENOUS | Status: DC
  Administered 2018-12-12 – 2018-12-16 (×5): 100 mg via INTRAVENOUS
  Filled 2018-12-12 (×6): qty 1

## 2018-12-12 MED ORDER — POTASSIUM CHLORIDE 10 MEQ/100ML IV SOLN
10.0000 meq | Freq: Once | INTRAVENOUS | Status: DC
  Filled 2018-12-12: qty 100

## 2018-12-12 MED ORDER — LEVALBUTEROL HCL 0.63 MG/3ML IN NEBU: 0.6300 mg | INHALATION_SOLUTION | Freq: Four times a day (QID) | RESPIRATORY_TRACT | Status: DC | PRN

## 2018-12-12 MED ORDER — MAGNESIUM SULFATE 2 GM/50ML IV SOLN
2.0000 g | Freq: Once | INTRAVENOUS | Status: AC
  Administered 2018-12-12: 2 g via INTRAVENOUS
  Filled 2018-12-12: qty 50

## 2018-12-12 MED ORDER — ONDANSETRON HCL 4 MG PO TABS: 4.0000 mg | ORAL_TABLET | Freq: Four times a day (QID) | ORAL | Status: DC | PRN

## 2018-12-12 MED ORDER — MIDAZOLAM HCL 2 MG/2ML IJ SOLN
2.0000 mg | INTRAMUSCULAR | Status: DC | PRN
  Administered 2018-12-12 – 2018-12-16 (×11): 2 mg via INTRAVENOUS
  Filled 2018-12-12 (×11): qty 2

## 2018-12-12 MED ORDER — FOLIC ACID 5 MG/ML IJ SOLN
1.0000 mg | Freq: Every day | INTRAMUSCULAR | Status: DC
  Administered 2018-12-12 – 2018-12-16 (×5): 1 mg via INTRAVENOUS
  Filled 2018-12-12 (×5): qty 0.2
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
## 2018-12-12 NOTE — ED Notes (Signed)
[place]- mother

## 2018-12-12 NOTE — ED Triage Notes (Signed)
EMS reports pt was acting erratic going to neighbors asking for water, pt out of control when EMS arrived,Versed 5 mg IM Left arm, been clean from drugs for a year. (?) 148/90-100-98% CBG 79
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
## 2018-12-12 NOTE — ED Notes (Signed)
ED TO INPATIENT HANDOFF REPORT  ED Nurse Name and Phone #: [phone] [name]  S Name/Age/Gender Haley Olsen 28 y.o. female Room/Bed: WA16/WA16  Code Status   Code Status: Full Code  Home/SNF/Other Home Patient oriented to: disoriented Is this baseline? no  Triage Complete: Triage complete  Chief Complaint Anxiety, Tachycardic  Triage Note EMS reports pt was acting erratic going to neighbors asking for water, pt out of control when EMS arrived,Versed 5 mg IM Left arm, been clean from drugs for a year. (?) 148/90-100-98% CBG 79    Allergies Allergies  Allergen Reactions  . Wasp Venom Protein   . Hydrocodone Hives    Level of Care/Admitting Diagnosis ED Disposition    ED Disposition Condition Comment   Admit  Hospital Area: [facility] [[id number]]  Level of Care: ICU [6]  Covid Evaluation: N/A  Diagnosis: Altered mental status [780.97.ICD-9-CM]  Admitting Physician: Haley Olsen [[id number]]  Attending Physician: Haley Olsen [phone]  Estimated length of stay: 5 - 7 days  Certification:: I certify this patient will need inpatient services for at least 2 midnights  PT Class (Do Not Modify): Inpatient [101]  PT Acc Code (Do Not Modify): Private [1]       B Medical/Surgery History Past Medical History:  Diagnosis Date  . Anxiety   . Arthritis   . Asthma   . Chlamydia   . Depression   . Diabetes mellitus without complication ([place])   . Drug addiction ([place])   . GERD (gastroesophageal reflux disease)   . Hepatitis C   . History of syphilis   . Migraine   . Seizures ([place])   . Stroke [facility])    Past Surgical History:  Procedure Laterality Date  . NOSE SURGERY     reconstructive     A IV Location/Drains/Wounds Patient Lines/Drains/Airways Status   Active Line/Drains/Airways    Name:   Placement date:   Placement time:   Site:   Days:   Peripheral IV 12/12/18 Left;Upper Arm   12/12/18    1236    Arm   less than 1   Urethral Catheter T Carter NT Latex;Straight-tip 14 Fr.   12/12/18    1227    Latex;Straight-tip   less than 1          Intake/Output Last 24 hours  Intake/Output Summary (Last 24 hours) at 12/12/2018 1620 Last data filed at 12/12/2018 1613 Gross per 24 hour  Intake 1100 ml  Output -  Net 1100 ml    Labs/Imaging Results for orders placed or performed during the hospital encounter of 12/12/18 (from the past 48 hour(s))  Urinalysis, Routine w reflex microscopic     Status: Abnormal   Collection Time: 12/12/18 10:43 AM  Result Value Ref Range   Color, Urine YELLOW YELLOW   APPearance CLEAR CLEAR   Specific Gravity, Urine 1.021 1.005 - 1.030   pH 5.0 5.0 - 8.0   Glucose, UA NEGATIVE NEGATIVE mg/dL   Hgb urine dipstick SMALL (A) NEGATIVE   Bilirubin Urine NEGATIVE NEGATIVE   Ketones, ur 80 (A) NEGATIVE mg/dL   Protein, ur 100 (A) NEGATIVE mg/dL   Nitrite NEGATIVE NEGATIVE   Leukocytes,Ua NEGATIVE NEGATIVE   RBC / HPF 0-5 0 - 5 RBC/hpf   WBC, UA 0-5 0 - 5 WBC/hpf   Bacteria, UA RARE (A) NONE SEEN   Squamous Epithelial / LPF 0-5 0 - 5   Mucus PRESENT     Comment: Performed at [facility]  [place] [address] [place], [place] [address]  Rapid urine drug screen (hospital performed)     Status: Abnormal   Collection Time: 12/12/18 12:30 PM  Result Value Ref Range   Opiates NONE DETECTED NONE DETECTED   Cocaine NONE DETECTED NONE DETECTED   Benzodiazepines POSITIVE (A) NONE DETECTED   Amphetamines POSITIVE (A) NONE DETECTED   Tetrahydrocannabinol POSITIVE (A) NONE DETECTED   Barbiturates NONE DETECTED NONE DETECTED    Comment: (NOTE) DRUG SCREEN FOR MEDICAL PURPOSES ONLY.  IF CONFIRMATION IS NEEDED FOR ANY PURPOSE, NOTIFY LAB WITHIN 5 DAYS. LOWEST DETECTABLE LIMITS FOR URINE DRUG SCREEN Drug Class                     Cutoff (ng/mL) Amphetamine and metabolites    1000 Barbiturate and metabolites    200 Benzodiazepine                 016 Tricyclics and  metabolites     300 Opiates and metabolites        300 Cocaine and metabolites        300 THC                            50 Performed at [facility], [place] [address], [place], [place] [address]   CBC with Differential/Platelet     Status: Abnormal   Collection Time: 12/12/18 12:32 PM  Result Value Ref Range   WBC 12.7 (H) 4.0 - 10.5 K/uL   RBC 4.32 3.87 - 5.11 MIL/uL   Hemoglobin 13.4 12.0 - 15.0 g/dL   HCT 39.7 36.0 - 46.0 %   MCV 91.9 80.0 - 100.0 fL   MCH 31.0 26.0 - 34.0 pg   MCHC 33.8 30.0 - 36.0 g/dL   RDW 13.0 11.5 - 15.5 %   Platelets 198 150 - 400 K/uL   nRBC 0.0 0.0 - 0.2 %   Neutrophils Relative % 74 %   Neutro Abs 9.4 (H) 1.7 - 7.7 K/uL   Lymphocytes Relative 19 %   Lymphs Abs 2.4 0.7 - 4.0 K/uL   Monocytes Relative 7 %   Monocytes Absolute 0.9 0.1 - 1.0 K/uL   Eosinophils Relative 0 %   Eosinophils Absolute 0.0 0.0 - 0.5 K/uL   Basophils Relative 0 %   Basophils Absolute 0.0 0.0 - 0.1 K/uL   Immature Granulocytes 0 %   Abs Immature Granulocytes 0.05 0.00 - 0.07 K/uL    Comment: Performed at [facility], [place] [address], [place], [place] [address]  Comprehensive metabolic panel     Status: Abnormal   Collection Time: 12/12/18 12:32 PM  Result Value Ref Range   Sodium 136 135 - 145 mmol/L   Potassium 2.9 (L) 3.5 - 5.1 mmol/L   Chloride 102 98 - 111 mmol/L   CO2 13 (L) 22 - 32 mmol/L   Glucose, Bld 82 70 - 99 mg/dL   BUN 23 (H) 6 - 20 mg/dL   Creatinine, Ser 1.00 0.44 - 1.00 mg/dL   Calcium 9.8 8.9 - 10.3 mg/dL   Total Protein 8.6 (H) 6.5 - 8.1 g/dL   Albumin 5.1 (H) 3.5 - 5.0 g/dL   AST 49 (H) 15 - 41 U/L   ALT 39 0 - 44 U/L   Alkaline Phosphatase 80 38 - 126 U/L   Total Bilirubin 1.4 (H) 0.3 - 1.2 mg/dL   GFR  calc non Af Amer >60 >60 mL/min   GFR calc Af Amer >60 >60 mL/min   Anion gap 21 (H) 5 - 15    Comment: Performed at [facility], [place] [address], [place], [place] [address]  Ethanol      Status: None   Collection Time: 12/12/18 12:32 PM  Result Value Ref Range   Alcohol, Ethyl (B) <10 <10 mg/dL    Comment: (NOTE) Lowest detectable limit for serum alcohol is 10 mg/dL. For medical purposes only. Performed at [facility], [place] [address], [place], [place] [address]   SARS Coronavirus 2 (CEPHEID - Performed in [place] hospital lab), Hosp Order     Status: None   Collection Time: 12/12/18 12:32 PM   Specimen: Nasopharyngeal Swab  Result Value Ref Range   SARS Coronavirus 2 NEGATIVE NEGATIVE    Comment: (NOTE) If result is NEGATIVE SARS-CoV-2 target nucleic acids are NOT DETECTED. The SARS-CoV-2 RNA is generally detectable in upper and lower  respiratory specimens during the acute phase of infection. The lowest  concentration of SARS-CoV-2 viral copies this assay can detect is 250  copies / mL. A negative result does not preclude SARS-CoV-2 infection  and should not be used as the sole basis for treatment or other  patient management decisions.  A negative result may occur with  improper specimen collection / handling, submission of specimen other  than nasopharyngeal swab, presence of viral mutation(s) within the  areas targeted by this assay, and inadequate number of viral copies  (<250 copies / mL). A negative result must be combined with clinical  observations, patient history, and epidemiological information. If result is POSITIVE SARS-CoV-2 target nucleic acids are DETECTED. The SARS-CoV-2 RNA is generally detectable in upper and lower  respiratory specimens dur ing the acute phase of infection.  Positive  results are indicative of active infection with SARS-CoV-2.  Clinical  correlation with patient history and other diagnostic information is  necessary to determine patient infection status.  Positive results do  not rule out bacterial infection or co-infection with other viruses. If result is PRESUMPTIVE POSTIVE SARS-CoV-2 nucleic acids  MAY BE PRESENT.   A presumptive positive result was obtained on the submitted specimen  and confirmed on repeat testing.  While 2019 novel coronavirus  (SARS-CoV-2) nucleic acids may be present in the submitted sample  additional confirmatory testing may be necessary for epidemiological  and / or clinical management purposes  to differentiate between  SARS-CoV-2 and other Sarbecovirus currently known to infect humans.  If clinically indicated additional testing with an alternate test  methodology [phone]) is advised. The SARS-CoV-2 RNA is generally  detectable in upper and lower respiratory sp ecimens during the acute  phase of infection. The expected result is Negative. Fact Sheet for Patients:  StrictlyIdeas.no Fact Sheet for Healthcare Providers: BankingDealers.co.za This test is not yet approved or cleared by the [place] FDA and has been authorized for detection and/or diagnosis of SARS-CoV-2 by FDA under an Emergency Use Authorization (EUA).  This EUA will remain in effect (meaning this test can be used) for the duration of the COVID-19 declaration under Section 564(b)(1) of the Act, 21 U.S.C. section 360bbb-3(b)(1), unless the authorization is terminated or revoked sooner. Performed at [facility], [place] [address], [place],  [address]   Ammonia     Status: None   Collection Time: 12/12/18 12:32 PM  Result Value Ref Range   Ammonia 26 9 - 35 umol/L  Comment: Performed at [facility], [place] [address] [place], [place] [address]   Ct Head Wo Contrast  Result Date: 12/12/2018 CLINICAL DATA:  Altered mental status. Previous stroke. Smoker. Drug abuse. EXAM: CT HEAD WITHOUT CONTRAST TECHNIQUE: Contiguous axial images were obtained from the base of the skull through the vertex without intravenous contrast. COMPARISON:  None. FINDINGS: Brain: The examination is limited by artifacts  produced by patient motion. The visualized cerebral hemispheres and posterior fossa structures have normal appearances. The ventricles are normal in size and position. No intracranial hemorrhage, mass lesion or CT evidence of acute infarction. Vascular: No hyperdense vessel or unexpected calcification. Skull: Normal. Negative for fracture or focal lesion. Sinuses/Orbits: No acute finding. Other: None. IMPRESSION: Limited examination due to patient motion. No visible abnormality. Electronically Signed   By: Claudie Revering M.D.   On: 12/12/2018 14:17   Dg Chest Port 1 View  Result Date: 12/12/2018 CLINICAL DATA:  Weakness. EXAM: PORTABLE CHEST 1 VIEW COMPARISON:  Radiograph of August 06, 2016. FINDINGS: The heart size and mediastinal contours are within normal limits. Both lungs are clear. The visualized skeletal structures are unremarkable. IMPRESSION: No active disease. Electronically Signed   By: Marijo Conception M.D.   On: 12/12/2018 13:28    Pending Labs Unresulted Labs (From admission, onward)    Start     Ordered   12/13/18 6195  Basic metabolic panel  Daily,   R     12/12/18 1531   12/13/18 0500  CBC  Daily,   R     12/12/18 1531   12/13/18 0500  Protime-INR  Tomorrow morning,   R     12/12/18 1531   12/13/18 0500  APTT  Tomorrow morning,   R     12/12/18 1531   12/12/18 1529  CBC  (heparin)  Once,   STAT    Comments: Baseline for heparin therapy IF NOT ALREADY DRAWN.  Notify MD if PLT < 100 K.    12/12/18 1531   12/12/18 1529  Creatinine, serum  (heparin)  Once,   STAT    Comments: Baseline for heparin therapy IF NOT ALREADY DRAWN.    12/12/18 1531   12/12/18 1529  HIV antibody (Routine Testing)  Once,   STAT     12/12/18 1531   12/12/18 1529  Calcium  Once,   STAT     12/12/18 1531   12/12/18 1529  Magnesium  Once,   STAT     12/12/18 1531   12/12/18 1529  Phosphorus  Once,   STAT     12/12/18 1531   12/12/18 1529  Urine culture  Once,   STAT     12/12/18 1531   12/12/18 1529   Brain natriuretic peptide  Once,   STAT     12/12/18 1531   12/12/18 1529  TSH  Once,   STAT     12/12/18 1531   12/12/18 1529  Hemoglobin A1c  Once,   STAT     12/12/18 1531   12/12/18 1529  Urinalysis, Routine w reflex microscopic  Once,   STAT     12/12/18 1531   12/12/18 1450  Lactic acid, plasma  Now then every 2 hours,   STAT     12/12/18 1449          Vitals/Pain Today's Vitals   12/12/18 1500 12/12/18 1507 12/12/18 1600 12/12/18 1618  BP: (!) 127/94 (!) 127/94 116/68   Pulse: 86 93 84  Resp:  18 12   Temp:      TempSrc:      SpO2: 100% 100% 100%   PainSc:    (S) Asleep    Isolation Precautions No active isolations  Medications Medications  potassium chloride 10 mEq in 100 mL IVPB (10 mEq Intravenous Not Given 12/12/18 1616)  heparin injection 5,000 Units (has no administration in time range)  sodium chloride flush (NS) 0.9 % injection 3 mL (has no administration in time range)  acetaminophen (TYLENOL) tablet 650 mg (has no administration in time range)    Or  acetaminophen (TYLENOL) suppository 650 mg (has no administration in time range)  ketorolac (TORADOL) 15 MG/ML injection 15 mg (has no administration in time range)  docusate sodium (COLACE) capsule 100 mg (has no administration in time range)  senna-docusate (Senokot-S) tablet 1 tablet (has no administration in time range)  sorbitol 70 % solution 30 mL (has no administration in time range)  magnesium citrate solution 1 Bottle (has no administration in time range)  ondansetron (ZOFRAN) tablet 4 mg (has no administration in time range)    Or  ondansetron (ZOFRAN) injection 4 mg (has no administration in time range)  albuterol (PROVENTIL) (2.5 MG/3ML) 0.083% nebulizer solution 2.5 mg (has no administration in time range)  levalbuterol (XOPENEX) nebulizer solution 0.63 mg (has no administration in time range)  multivitamin with minerals tablet 1 tablet (has no administration in time range)  haloperidol  lactate (HALDOL) injection 2 mg (has no administration in time range)  potassium chloride 10 mEq in 100 mL IVPB (10 mEq Intravenous New Bag/Given 12/12/18 1615)  magnesium sulfate IVPB 2 g 50 mL (2 g Intravenous New Bag/Given 12/12/18 1615)  0.9 %  sodium chloride infusion (has no administration in time range)  insulin aspart (novoLOG) injection 0-9 Units (has no administration in time range)  sodium chloride 0.9 % bolus 1,000 mL (0 mLs Intravenous Stopped 12/12/18 1445)  ziprasidone (GEODON) injection 10 mg (10 mg Intramuscular Given 12/12/18 1129)  sterile water (preservative free) injection (1.2 mLs  Given 12/12/18 1129)  potassium chloride 10 mEq in 100 mL IVPB (0 mEq Intravenous Stopped 12/12/18 1613)    Mobility walks High fall risk   Focused Assessments    R Recommendations: See Admitting Provider Note  Report given to:   Additional Notes:
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
## 2018-12-12 NOTE — Progress Notes (Signed)
Lab was unable to draw labs peripherally. Pt has a history of IV drug use and stated her only good vein was in her neck. Per Dr. Lamonte Sakai, labs were drawn from the PIV in the patient's left upper arm. Lab was notified
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
## 2018-12-12 NOTE — Progress Notes (Addendum)
eLink Physician-Brief Progress Note Patient Name: Haley Olsen DOB: 1990/09/09 MRN: [id number]   Date of Service  12/12/2018  HPI/Events of Note  Requested by Dr. Lamonte Sakai to contact Poison Control about this patient with AG acidosis without defined etiology. Poison control recommends avoid QT prolonging agents for agitation.   eICU Interventions  After discussion with Poison Control will order: 1. Lithium level. 2. Tylenol level. 3. CK. 4. Repeat BMP. 5. Repeat EKG. 6. D/C Haldol. 7. Versed 2 mg IV Q 2 hours PRN agitation.  8. Serum Osmolarity     Intervention Category Major Interventions: Delirium, psychosis, severe agitation - evaluation and management  Kiernan Farkas [place] 12/12/2018, 8:19 PM

## 2018-12-13 ENCOUNTER — Encounter: Payer: Self-pay | Admitting: Family Medicine

## 2018-12-13 ENCOUNTER — Other Ambulatory Visit: Payer: Self-pay

## 2018-12-13 ENCOUNTER — Inpatient Hospital Stay (HOSPITAL_COMMUNITY): Payer: Self-pay

## 2018-12-13 DIAGNOSIS — F1910 Other psychoactive substance abuse, uncomplicated: Secondary | ICD-10-CM | POA: Insufficient documentation

## 2018-12-13 DIAGNOSIS — F1994 Other psychoactive substance use, unspecified with psychoactive substance-induced mood disorder: Secondary | ICD-10-CM

## 2018-12-13 LAB — BASIC METABOLIC PANEL
Anion gap: 11 (ref 5–15)
BUN: 12 mg/dL (ref 6–20)
CO2: 17 mmol/L — ABNORMAL LOW (ref 22–32)
Calcium: 8 mg/dL — ABNORMAL LOW (ref 8.9–10.3)
Chloride: 108 mmol/L (ref 98–111)
Creatinine, Ser: 0.79 mg/dL (ref 0.44–1.00)
GFR calc Af Amer: >60 mL/min (ref >60)
GFR calc non Af Amer: >60 mL/min (ref >60)
Glucose, Bld: 250 mg/dL — ABNORMAL HIGH (ref 70–99)
Potassium: 4.1 mmol/L (ref 3.5–5.1)
Sodium: 136 mmol/L (ref 135–145)

## 2018-12-13 LAB — URINALYSIS, ROUTINE W REFLEX MICROSCOPIC
Bilirubin Urine: NEGATIVE
Glucose, UA: NEGATIVE mg/dL
Ketones, ur: 80 mg/dL — AB
Leukocytes,Ua: NEGATIVE
Nitrite: NEGATIVE
Protein, ur: 100 mg/dL — AB
Specific Gravity, Urine: 1.021 (ref 1.005–1.030)
pH: 5 (ref 5.0–8.0)

## 2018-12-13 LAB — CBC
HCT: 32.2 % — ABNORMAL LOW (ref 36.0–46.0)
Hemoglobin: 10.8 g/dL — ABNORMAL LOW (ref 12.0–15.0)
MCH: 31.4 pg (ref 26.0–34.0)
MCHC: 33.5 g/dL (ref 30.0–36.0)
MCV: 93.6 fL (ref 80.0–100.0)
Platelets: 153 10*3/uL (ref 150–400)
RBC: 3.44 MIL/uL — ABNORMAL LOW (ref 3.87–5.11)
RDW: 13.4 % (ref 11.5–15.5)
WBC: 6.3 10*3/uL (ref 4.0–10.5)
nRBC: 0 % (ref 0.0–0.2)

## 2018-12-13 LAB — PROTIME-INR
INR: 1.2 (ref 0.8–1.2)
Prothrombin Time: 14.8 seconds (ref 11.4–15.2)

## 2018-12-13 LAB — GLUCOSE, CAPILLARY
Glucose-Capillary: 105 mg/dL — ABNORMAL HIGH (ref 70–99)
Glucose-Capillary: 109 mg/dL — ABNORMAL HIGH (ref 70–99)
Glucose-Capillary: 156 mg/dL — ABNORMAL HIGH (ref 70–99)
Glucose-Capillary: 69 mg/dL — ABNORMAL LOW (ref 70–99)
Glucose-Capillary: 71 mg/dL (ref 70–99)
Glucose-Capillary: 85 mg/dL (ref 70–99)
Glucose-Capillary: 90 mg/dL (ref 70–99)
Glucose-Capillary: 95 mg/dL (ref 70–99)

## 2018-12-13 LAB — APTT: aPTT: 34 seconds (ref 24–36)

## 2018-12-13 LAB — T4, FREE: Free T4: 1.33 ng/dL — ABNORMAL HIGH (ref 0.61–1.12)

## 2018-12-13 LAB — TOXICOLOGY SCREEN, URINE
Amphetamine Scrn: POSITIVE
COCAINE: POSITIVE
THC: POSITIVE

## 2018-12-13 LAB — OSMOLALITY: Osmolality: 297 mOsm/kg — ABNORMAL HIGH (ref 275–295)

## 2018-12-13 LAB — MRSA PCR SCREENING: MRSA by PCR: POSITIVE — AB

## 2018-12-13 MED ORDER — LEVETIRACETAM 500 MG PO TABS
1500.0000 mg | ORAL_TABLET | Freq: Two times a day (BID) | ORAL | Status: DC
  Administered 2018-12-13 – 2018-12-16 (×7): 1500 mg via ORAL
  Filled 2018-12-13 (×8): qty 3

## 2018-12-13 MED ORDER — CHLORHEXIDINE GLUCONATE CLOTH 2 % EX PADS
6.0000 | MEDICATED_PAD | Freq: Every day | CUTANEOUS | Status: DC
  Administered 2018-12-13 – 2018-12-15 (×3): 6 via TOPICAL

## 2018-12-13 MED ORDER — DEXTROSE 50 % IV SOLN
INTRAVENOUS | Status: AC
  Filled 2018-12-13: qty 50

## 2018-12-13 MED ORDER — TRAZODONE HCL 50 MG PO TABS
50.0000 mg | ORAL_TABLET | Freq: Every day | ORAL | Status: DC
  Administered 2018-12-13 – 2018-12-15 (×3): 50 mg via ORAL
  Filled 2018-12-13 (×3): qty 1

## 2018-12-13 MED ORDER — DEXTROSE 50 % IV SOLN
12.5000 g | INTRAVENOUS | Status: AC
  Administered 2018-12-13: 12.5 g via INTRAVENOUS

## 2018-12-13 MED ORDER — NICOTINE 21 MG/24HR TD PT24
21.0000 mg | MEDICATED_PATCH | Freq: Every day | TRANSDERMAL | Status: DC
  Administered 2018-12-13 – 2018-12-16 (×4): 21 mg via TRANSDERMAL
  Filled 2018-12-13 (×4): qty 1

## 2018-12-13 MED ORDER — ALUM & MAG HYDROXIDE-SIMETH 200-200-20 MG/5ML PO SUSP
30.0000 mL | Freq: Once | ORAL | Status: AC
  Administered 2018-12-13: 30 mL via ORAL
  Filled 2018-12-13: qty 30

## 2018-12-13 MED ORDER — MUPIROCIN 2 % EX OINT
1.0000 "application " | TOPICAL_OINTMENT | Freq: Two times a day (BID) | CUTANEOUS | Status: DC
  Administered 2018-12-13 – 2018-12-16 (×7): 1 via NASAL
  Filled 2018-12-13 (×2): qty 22

## 2018-12-13 MED ORDER — CHLORHEXIDINE GLUCONATE CLOTH 2 % EX PADS
6.0000 | MEDICATED_PAD | Freq: Every day | CUTANEOUS | Status: DC
  Administered 2018-12-14 – 2018-12-16 (×2): 6 via TOPICAL

## 2018-12-13 MED ORDER — CITALOPRAM HYDROBROMIDE 20 MG PO TABS
20.0000 mg | ORAL_TABLET | Freq: Every day | ORAL | Status: DC
  Administered 2018-12-13 – 2018-12-16 (×4): 20 mg via ORAL
  Filled 2018-12-13 (×4): qty 1

## 2018-12-13 MED ORDER — GABAPENTIN 100 MG PO CAPS
100.0000 mg | ORAL_CAPSULE | Freq: Two times a day (BID) | ORAL | Status: DC
  Administered 2018-12-13 – 2018-12-16 (×5): 100 mg via ORAL
  Filled 2018-12-13 (×6): qty 1
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
## 2018-12-13 NOTE — Consult Note (Addendum)
Telepsych Consultation   Reason for Consult:  "Altered mental status due to substance abuse benzodiazepine amphetamine THC" Referring Physician:  Dr. Hosie Poisson Location of Patient: [id number] Location of Provider: [facility]  Patient Identification: Haley Olsen MRN:  [id number] Principal Diagnosis: Substance induced mood disorder ([place]) Diagnosis:  Principal Problem:   Altered mental status Active Problems:   TOBACCO USER   Opioid use disorder, severe, dependence ([place])   Hepatitis C   Drug abuse in remission ([place])   Depression, recurrent ([place])   Bipolar disorder ([place])   Cerebrovascular accident (CVA) ([place])   Type 2 diabetes mellitus without complication, without long-term current use of insulin ([place])   Total Time spent with patient: 1 hour  Subjective:   Haley Olsen is a 28 y.o. female patient admitted with acute encephalopathy.  HPI:   Per chart review, patient was admitted with acute encephalopathy in the setting of illicit substance use. UDS was positive for amphetamines, benzodiazepines and THC. She would like assistance with substance abuse.   On interview, Haley Olsen reports using marijuana daily for anxiety. She is unsure of the amount. She denies other illicit substance use although UDS is positive for the above. She requests Suboxone but is unable to elaborate why she wants it prescribed. She does report that she has chronic right foot pain. She reports becoming easily irritated. She does not provide a history consistent with manic symptoms although she likely has mood lability. Celexa has been effective for mood in the past. She reports good effect with Klonopin for anxiety in the past. She reports poor appetite. She reports poor sleep. She denies SI, HI or AVH. She previously worked until a year ago. She had a stroke 3 weeks ago. She takes anticonvulsant medication. Medications recommendations were discussed including Remeron for mood with  patient. She reports that she cannot take Remeron because she "rejected that medication." She agrees to restart Celexa for mood including a sleep aid. She voices several complaints during the interview. She reports that she has been waiting too long for her food and reports, "I hate this hospital."   Past Psychiatric History: Anxiety, BPAD, depression and polysubstance abuse.   Risk to Self:  None. Denies SI.  Risk to Others:  None. Denies HI.  Prior Inpatient Therapy:  She was admitted to [facility] in 07/8248 for SI and illicit substance use.  Prior Outpatient Therapy:  [facility] and [facility] with Suboxone.   Past Medical History:  Past Medical History:  Diagnosis Date  . Anxiety   . Arthritis   . Asthma   . Chlamydia   . Depression   . Diabetes mellitus without complication ([place])   . Drug addiction ([place])   . GERD (gastroesophageal reflux disease)   . Hepatitis C   . History of syphilis   . Migraine   . Seizures ([place])   . Stroke [facility])     Past Surgical History:  Procedure Laterality Date  . NOSE SURGERY     reconstructive   Family History:  Family History  Problem Relation Age of Onset  . Hyperlipidemia Maternal Grandmother   . Heart disease Maternal Grandmother   . Drug abuse Maternal Grandmother   . Diabetes Maternal Grandmother   . Depression Maternal Grandmother   . Asthma Maternal Grandmother   . Heart attack Maternal Grandmother 50  . Depression Mother   . Drug abuse Mother        in recovery  . Alcohol abuse Father   .  Arthritis Father   . Depression Father   . Drug abuse Father   . Hearing loss Father   . Diabetes Maternal Grandfather   . Drug abuse Maternal Grandfather   . Heart attack Maternal Grandfather   . Heart disease Maternal Grandfather   . Heart attack Paternal Grandmother 68  . Alcohol abuse Paternal Grandmother   . Depression Paternal Grandmother    Family Psychiatric  History: As listed above and brother-committed suicide.  Social History:  Social  History   Substance and Sexual Activity  Alcohol Use No  . Frequency: Never     Social History   Substance and Sexual Activity  Drug Use Not Currently  . Types: IV, Marijuana, Heroin   Comment: heroine - Last IV drugs was 1 month ago    Social History   Socioeconomic History  . Marital status: Single    Spouse name: Not on file  . Number of children: 1  . Years of education: Not on file  . Highest education level: Not on file  Occupational History  . Not on file  Social Needs  . Financial resource strain: Not on file  . Food insecurity    Worry: Not on file    Inability: Not on file  . Transportation needs    Medical: Not on file    Non-medical: Not on file  Tobacco Use  . Smoking status: Current Every Day Smoker    Packs/day: 0.25    Years: 12.00    Pack years: 3.00    Types: Cigarettes  . Smokeless tobacco: Never Used  . Tobacco comment: declined  Substance and Sexual Activity  . Alcohol use: No    Frequency: Never  . Drug use: Not Currently    Types: IV, Marijuana, Heroin    Comment: heroine - Last IV drugs was 1 month ago  . Sexual activity: Not Currently    Birth control/protection: Condom, None    Comment: female partners  Lifestyle  . Physical activity    Days per week: Not on file    Minutes per session: Not on file  . Stress: Not on file  Relationships  . Social Herbalist on phone: Not on file    Gets together: Not on file    Attends religious service: Not on file    Active member of club or organization: Not on file    Attends meetings of clubs or organizations: Not on file    Relationship status: Not on file  Other Topics Concern  . Not on file  Social History Narrative   12/08/18   From: [place] area    Living: Lives with Dad and occasionally goes to her moms   Work: not currently   Family: gets along with parents      Additional Social History: She lives with her father. She was previously a Merchandiser, retail. She last  worked a year ago. She has a 63 y/o son who is in foster care.     Allergies:   Allergies  Allergen Reactions  . Wasp Venom Protein   . Hydrocodone Hives    Labs:  Results for orders placed or performed during the hospital encounter of 12/12/18 (from the past 48 hour(s))  Urinalysis, Routine w reflex microscopic     Status: Abnormal   Collection Time: 12/12/18 10:43 AM  Result Value Ref Range   Color, Urine YELLOW YELLOW   APPearance CLEAR CLEAR   Specific Gravity, Urine 1.021 1.005 -  1.030   pH 5.0 5.0 - 8.0   Glucose, UA NEGATIVE NEGATIVE mg/dL   Hgb urine dipstick SMALL (A) NEGATIVE   Bilirubin Urine NEGATIVE NEGATIVE   Ketones, ur 80 (A) NEGATIVE mg/dL   Protein, ur 100 (A) NEGATIVE mg/dL   Nitrite NEGATIVE NEGATIVE   Leukocytes,Ua NEGATIVE NEGATIVE   RBC / HPF 0-5 0 - 5 RBC/hpf   WBC, UA 0-5 0 - 5 WBC/hpf   Bacteria, UA RARE (A) NONE SEEN   Squamous Epithelial / LPF 0-5 0 - 5   Mucus PRESENT     Comment: Performed at [facility], [place] [address] [place], [place] [address]  Rapid urine drug screen (hospital performed)     Status: Abnormal   Collection Time: 12/12/18 12:30 PM  Result Value Ref Range   Opiates NONE DETECTED NONE DETECTED   Cocaine NONE DETECTED NONE DETECTED   Benzodiazepines POSITIVE (A) NONE DETECTED   Amphetamines POSITIVE (A) NONE DETECTED   Tetrahydrocannabinol POSITIVE (A) NONE DETECTED   Barbiturates NONE DETECTED NONE DETECTED    Comment: (NOTE) DRUG SCREEN FOR MEDICAL PURPOSES ONLY.  IF CONFIRMATION IS NEEDED FOR ANY PURPOSE, NOTIFY LAB WITHIN 5 DAYS. LOWEST DETECTABLE LIMITS FOR URINE DRUG SCREEN Drug Class                     Cutoff (ng/mL) Amphetamine and metabolites    1000 Barbiturate and metabolites    200 Benzodiazepine                 132 Tricyclics and metabolites     300 Opiates and metabolites        300 Cocaine and metabolites        300 THC                            50 Performed at [facility], [place] [address] [place], [place] [address]   CBC with Differential/Platelet     Status: Abnormal   Collection Time: 12/12/18 12:32 PM  Result Value Ref Range   WBC 12.7 (H) 4.0 - 10.5 K/uL   RBC 4.32 3.87 - 5.11 MIL/uL   Hemoglobin 13.4 12.0 - 15.0 g/dL   HCT 39.7 36.0 - 46.0 %   MCV 91.9 80.0 - 100.0 fL   MCH 31.0 26.0 - 34.0 pg   MCHC 33.8 30.0 - 36.0 g/dL   RDW 13.0 11.5 - 15.5 %   Platelets 198 150 - 400 K/uL   nRBC 0.0 0.0 - 0.2 %   Neutrophils Relative % 74 %   Neutro Abs 9.4 (H) 1.7 - 7.7 K/uL   Lymphocytes Relative 19 %   Lymphs Abs 2.4 0.7 - 4.0 K/uL   Monocytes Relative 7 %   Monocytes Absolute 0.9 0.1 - 1.0 K/uL   Eosinophils Relative 0 %   Eosinophils Absolute 0.0 0.0 - 0.5 K/uL   Basophils Relative 0 %   Basophils Absolute 0.0 0.0 - 0.1 K/uL   Immature Granulocytes 0 %   Abs Immature Granulocytes 0.05 0.00 - 0.07 K/uL    Comment: Performed at [facility], [place] [address] [place], [place] [address]  Comprehensive metabolic panel     Status: Abnormal   Collection Time: 12/12/18 12:32 PM  Result Value Ref Range   Sodium 136 135 - 145 mmol/L   Potassium 2.9 (L) 3.5 - 5.1 mmol/L   Chloride 102 98 -  111 mmol/L   CO2 13 (L) 22 - 32 mmol/L   Glucose, Bld 82 70 - 99 mg/dL   BUN 23 (H) 6 - 20 mg/dL   Creatinine, Ser 1.00 0.44 - 1.00 mg/dL   Calcium 9.8 8.9 - 10.3 mg/dL   Total Protein 8.6 (H) 6.5 - 8.1 g/dL   Albumin 5.1 (H) 3.5 - 5.0 g/dL   AST 49 (H) 15 - 41 U/L   ALT 39 0 - 44 U/L   Alkaline Phosphatase 80 38 - 126 U/L   Total Bilirubin 1.4 (H) 0.3 - 1.2 mg/dL   GFR calc non Af Amer >60 >60 mL/min   GFR calc Af Amer >60 >60 mL/min   Anion gap 21 (H) 5 - 15    Comment: Performed at [facility], [place] [address], [place], [place] [address]  Ethanol     Status: None   Collection Time: 12/12/18 12:32 PM  Result Value Ref Range   Alcohol, Ethyl (B) <10 <10 mg/dL    Comment: (NOTE) Lowest detectable  limit for serum alcohol is 10 mg/dL. For medical purposes only. Performed at [facility], [place] [address] [place], [place] [address]   SARS Coronavirus 2 (CEPHEID - Performed in [place] hospital lab), Hosp Order     Status: None   Collection Time: 12/12/18 12:32 PM   Specimen: Nasopharyngeal Swab  Result Value Ref Range   SARS Coronavirus 2 NEGATIVE NEGATIVE    Comment: (NOTE) If result is NEGATIVE SARS-CoV-2 target nucleic acids are NOT DETECTED. The SARS-CoV-2 RNA is generally detectable in upper and lower  respiratory specimens during the acute phase of infection. The lowest  concentration of SARS-CoV-2 viral copies this assay can detect is 250  copies / mL. A negative result does not preclude SARS-CoV-2 infection  and should not be used as the sole basis for treatment or other  patient management decisions.  A negative result may occur with  improper specimen collection / handling, submission of specimen other  than nasopharyngeal swab, presence of viral mutation(s) within the  areas targeted by this assay, and inadequate number of viral copies  (<250 copies / mL). A negative result must be combined with clinical  observations, patient history, and epidemiological information. If result is POSITIVE SARS-CoV-2 target nucleic acids are DETECTED. The SARS-CoV-2 RNA is generally detectable in upper and lower  respiratory specimens dur ing the acute phase of infection.  Positive  results are indicative of active infection with SARS-CoV-2.  Clinical  correlation with patient history and other diagnostic information is  necessary to determine patient infection status.  Positive results do  not rule out bacterial infection or co-infection with other viruses. If result is PRESUMPTIVE POSTIVE SARS-CoV-2 nucleic acids MAY BE PRESENT.   A presumptive positive result was obtained on the submitted specimen  and confirmed on repeat testing.  While 2019 novel  coronavirus  (SARS-CoV-2) nucleic acids may be present in the submitted sample  additional confirmatory testing may be necessary for epidemiological  and / or clinical management purposes  to differentiate between  SARS-CoV-2 and other Sarbecovirus currently known to infect humans.  If clinically indicated additional testing with an alternate test  methodology [phone]) is advised. The SARS-CoV-2 RNA is generally  detectable in upper and lower respiratory sp ecimens during the acute  phase of infection. The expected result is Negative. Fact Sheet for Patients:  StrictlyIdeas.no Fact Sheet for Healthcare Providers: BankingDealers.co.za This test is not yet approved or  cleared by the [place] and has been authorized for detection and/or diagnosis of SARS-CoV-2 by FDA under an Emergency Use Authorization (EUA).  This EUA will remain in effect (meaning this test can be used) for the duration of the COVID-19 declaration under Section 564(b)(1) of the Act, 21 U.S.C. section 360bbb-3(b)(1), unless the authorization is terminated or revoked sooner. Performed at [facility], [place] [address], [place], [place] [address]   Ammonia     Status: None   Collection Time: 12/12/18 12:32 PM  Result Value Ref Range   Ammonia 26 9 - 35 umol/L    Comment: Performed at [facility], [place] [address], [place], [place] [address]  Calcium     Status: Abnormal   Collection Time: 12/12/18  3:29 PM  Result Value Ref Range   Calcium 8.6 (L) 8.9 - 10.3 mg/dL    Comment: Performed at [facility], [place] [address] [place], [place] [address]  Magnesium     Status: Abnormal   Collection Time: 12/12/18  3:29 PM  Result Value Ref Range   Magnesium 3.3 (H) 1.7 - 2.4 mg/dL    Comment: Performed at [facility], [place] [address] [place], [place] [address]  Phosphorus     Status: None    Collection Time: 12/12/18  3:29 PM  Result Value Ref Range   Phosphorus 3.7 2.5 - 4.6 mg/dL    Comment: Performed at [facility], [place] [address], [place], [place] [address]  Brain natriuretic peptide     Status: None   Collection Time: 12/12/18  3:29 PM  Result Value Ref Range   B Natriuretic Peptide 36.9 0.0 - 100.0 pg/mL    Comment: Performed at [facility], [place] [address] [place], [place] [address]  TSH     Status: Abnormal   Collection Time: 12/12/18  3:29 PM  Result Value Ref Range   TSH 0.286 (L) 0.350 - 4.500 uIU/mL    Comment: Performed by a 3rd Generation assay with a functional sensitivity of <=0.01 uIU/mL. Performed at [facility], [place] [address] [place], [place] [address]   Hemoglobin A1c     Status: None   Collection Time: 12/12/18  3:29 PM  Result Value Ref Range   Hgb A1c MFr Bld 5.1 4.8 - 5.6 %    Comment: (NOTE) Pre diabetes:          5.7%-6.4% Diabetes:              >6.4% Glycemic control for   <7.0% adults with diabetes    Mean Plasma Glucose 99.67 mg/dL    Comment: Performed at [place] [address], [place], [place] [address]  Urinalysis, Routine w reflex microscopic     Status: Abnormal   Collection Time: 12/12/18  3:29 PM  Result Value Ref Range   Color, Urine YELLOW YELLOW   APPearance CLEAR CLEAR   Specific Gravity, Urine 1.021 1.005 - 1.030   pH 5.0 5.0 - 8.0   Glucose, UA NEGATIVE NEGATIVE mg/dL   Hgb urine dipstick SMALL (A) NEGATIVE   Bilirubin Urine NEGATIVE NEGATIVE   Ketones, ur 80 (A) NEGATIVE mg/dL   Protein, ur 100 (A) NEGATIVE mg/dL   Nitrite NEGATIVE NEGATIVE   Leukocytes,Ua NEGATIVE NEGATIVE   RBC / HPF 0-5 0 - 5 RBC/hpf   WBC, UA 0-5 0 - 5 WBC/hpf   Bacteria, UA RARE (A) NONE SEEN  Squamous Epithelial / LPF 0-5 0 - 5   Mucus PRESENT    Hyaline Casts, UA PRESENT     Comment: Performed at [facility], [place] [address], [place], [place]  [address]  Lactic acid, plasma     Status: None   Collection Time: 12/12/18  5:54 PM  Result Value Ref Range   Lactic Acid, Venous 0.9 0.5 - 1.9 mmol/L    Comment: Performed at [facility], [place] [address] [place], [place] [address]  Glucose, capillary     Status: Abnormal   Collection Time: 12/12/18  7:57 PM  Result Value Ref Range   Glucose-Capillary 57 (L) 70 - 99 mg/dL   Comment 1 Notify RN    Comment 2 Document in Chart   Lithium level     Status: Abnormal   Collection Time: 12/12/18  8:11 PM  Result Value Ref Range   Lithium Lvl <0.06 (L) 0.60 - 1.20 mmol/L    Comment: Performed at [facility], [place] [address] [place], [place] [address]  Acetaminophen level     Status: Abnormal   Collection Time: 12/12/18  8:11 PM  Result Value Ref Range   Acetaminophen (Tylenol), Serum <10 (L) 10 - 30 ug/mL    Comment: (NOTE) Therapeutic concentrations vary significantly. A range of 10-30 ug/mL  may be an effective concentration for many patients. However, some  are best treated at concentrations outside of this range. Acetaminophen concentrations >150 ug/mL at 4 hours after ingestion  and >50 ug/mL at 12 hours after ingestion are often associated with  toxic reactions. Performed at [facility], [place] [address] [place], [place] [address]   Osmolality     Status: Abnormal   Collection Time: 12/12/18  8:12 PM  Result Value Ref Range   Osmolality 297 (H) 275 - 295 mOsm/kg    Comment: Performed at [facility], [place] [address] [place], [place] [address]  Glucose, capillary     Status: None   Collection Time: 12/12/18  8:28 PM  Result Value Ref Range   Glucose-Capillary 96 70 - 99 mg/dL  Lactic acid, plasma     Status: None   Collection Time: 12/12/18  9:00 PM  Result Value Ref Range   Lactic Acid, Venous 1.0 0.5 - 1.9 mmol/L    Comment: Performed at [facility], [place] [address], [place], [place] [address]   Basic metabolic panel     Status: Abnormal   Collection Time: 12/12/18 10:03 PM  Result Value Ref Range   Sodium 138 135 - 145 mmol/L   Potassium 4.0 3.5 - 5.1 mmol/L    Comment: DELTA CHECK NOTED SLIGHT HEMOLYSIS    Chloride 110 98 - 111 mmol/L   CO2 14 (L) 22 - 32 mmol/L   Glucose, Bld 102 (H) 70 - 99 mg/dL   BUN 14 6 - 20 mg/dL   Creatinine, Ser 0.85 0.44 - 1.00 mg/dL   Calcium 8.5 (L) 8.9 - 10.3 mg/dL   GFR calc non Af Amer >60 >60 mL/min   GFR calc Af Amer >60 >60 mL/min   Anion gap 14 5 - 15    Comment: Performed at [facility], [place] [address], [place], [place] [address]  CK     Status: None   Collection Time: 12/12/18 10:03 PM  Result Value Ref Range   Total CK 132 38 - 234 U/L    Comment: Performed at [facility], [place] [address], [place], [place] [address]  Glucose, capillary     Status: Abnormal   Collection Time: 12/12/18 11:40 PM  Result Value Ref Range   Glucose-Capillary 64 (L) 70 - 99 mg/dL   Comment 1 Notify RN    Comment 2 Document in Chart   Glucose, capillary     Status: Abnormal   Collection Time: 12/13/18 12:26 AM  Result Value Ref Range   Glucose-Capillary 105 (H) 70 - 99 mg/dL  Basic metabolic panel     Status: Abnormal   Collection Time: 12/13/18  2:35 AM  Result Value Ref Range   Sodium 136 135 - 145 mmol/L   Potassium 4.1 3.5 - 5.1 mmol/L    Comment: MODERATE HEMOLYSIS   Chloride 108 98 - 111 mmol/L   CO2 17 (L) 22 - 32 mmol/L   Glucose, Bld 250 (H) 70 - 99 mg/dL   BUN 12 6 - 20 mg/dL   Creatinine, Ser 0.79 0.44 - 1.00 mg/dL   Calcium 8.0 (L) 8.9 - 10.3 mg/dL   GFR calc non Af Amer >60 >60 mL/min   GFR calc Af Amer >60 >60 mL/min   Anion gap 11 5 - 15    Comment: Performed at [facility], [place] [address] [place], [place] [address]  CBC     Status: Abnormal   Collection Time: 12/13/18  2:35 AM  Result Value Ref Range   WBC 6.3 4.0 - 10.5 K/uL   RBC 3.44 (L) 3.87 - 5.11 MIL/uL    Hemoglobin 10.8 (L) 12.0 - 15.0 g/dL   HCT 32.2 (L) 36.0 - 46.0 %   MCV 93.6 80.0 - 100.0 fL   MCH 31.4 26.0 - 34.0 pg   MCHC 33.5 30.0 - 36.0 g/dL   RDW 13.4 11.5 - 15.5 %   Platelets 153 150 - 400 K/uL   nRBC 0.0 0.0 - 0.2 %    Comment: Performed at [facility], [place] [address], [place], [place] [address]  Protime-INR     Status: None   Collection Time: 12/13/18  2:35 AM  Result Value Ref Range   Prothrombin Time 14.8 11.4 - 15.2 seconds   INR 1.2 0.8 - 1.2    Comment: (NOTE) INR goal varies based on device and disease states. Performed at [facility], [place] [address], [place], [place] [address]   APTT     Status: None   Collection Time: 12/13/18  2:35 AM  Result Value Ref Range   aPTT 34 24 - 36 seconds    Comment: Performed at [facility], [place] [address], [place], [place] [address]  MRSA PCR Screening     Status: Abnormal   Collection Time: 12/13/18  3:17 AM   Specimen: Nasal Mucosa; Nasopharyngeal  Result Value Ref Range   MRSA by PCR POSITIVE (A) NEGATIVE    Comment:        The GeneXpert MRSA Assay (FDA approved for NASAL specimens only), is one component of a comprehensive MRSA colonization surveillance program. It is not intended to diagnose MRSA infection nor to guide or monitor treatment for MRSA infections. RESULT CALLED TO, READ BACK BY AND VERIFIED WITH: LONG, M. RN @1324  ON 7.20.20 BY [facility] Performed at [facility], [place] [address], [place], [place] [address]   Glucose, capillary     Status: Abnormal   Collection Time: 12/13/18  4:03 AM  Result Value Ref Range   Glucose-Capillary  69 (L) 70 - 99 mg/dL   Comment 1 Notify RN    Comment 2 Document in Chart   Glucose, capillary     Status: Abnormal   Collection Time: 12/13/18  4:51 AM  Result Value Ref Range   Glucose-Capillary 156 (H) 70 - 99 mg/dL  Glucose, capillary     Status: None   Collection Time: 12/13/18  7:44 AM   Result Value Ref Range   Glucose-Capillary 71 70 - 99 mg/dL  T4, free     Status: Abnormal   Collection Time: 12/13/18 10:26 AM  Result Value Ref Range   Free T4 1.33 (H) 0.61 - 1.12 ng/dL    Comment: (NOTE) Biotin ingestion may interfere with free T4 tests. If the results are inconsistent with the TSH level, previous test results, or the clinical presentation, then consider biotin interference. If needed, order repeat testing after stopping biotin. Performed at [facility], [place] [address] [place], [place] [address]   Glucose, capillary     Status: None   Collection Time: 12/13/18 12:33 PM  Result Value Ref Range   Glucose-Capillary 95 70 - 99 mg/dL    Medications:  Current Facility-Administered Medications  Medication Dose Route Frequency Provider Last Rate Last Dose  . 0.9 %  sodium chloride infusion   Intravenous Continuous Shahmehdi, Seyed A, MD 125 mL/hr at 12/13/18 0500    . acetaminophen (TYLENOL) tablet 650 mg  650 mg Oral Q6H PRN Shahmehdi, Seyed A, MD       Or  . acetaminophen (TYLENOL) suppository 650 mg  650 mg Rectal Q6H PRN Shahmehdi, Seyed A, MD      . albuterol (PROVENTIL) (2.5 MG/3ML) 0.083% nebulizer solution 2.5 mg  2.5 mg Nebulization Q2H PRN Shahmehdi, Seyed A, MD      . Chlorhexidine Gluconate Cloth 2 % PADS 6 each  6 each Topical Daily Deatra James, MD   6 each at 12/13/18 [phone]  . [START ON 12/14/2018] Chlorhexidine Gluconate Cloth 2 % PADS 6 each  6 each Topical Q0600 Hosie Poisson, MD      . docusate sodium (COLACE) capsule 100 mg  100 mg Oral BID Shahmehdi, Seyed A, MD      . folic acid injection 1 mg  1 mg Intravenous Daily Collene Gobble, MD   1 mg at 12/13/18 0903  . heparin injection 5,000 Units  5,000 Units Subcutaneous Q8H Skipper Cliche A, MD   5,000 Units at 12/13/18 0605  . insulin aspart (novoLOG) injection 0-9 Units  0-9 Units Subcutaneous Q4H Shahmehdi, Seyed A, MD      . ketorolac (TORADOL) 15 MG/ML injection 15 mg  15 mg  Intravenous Q6H PRN Shahmehdi, Seyed A, MD   15 mg at 12/13/18 1230  . levalbuterol (XOPENEX) nebulizer solution 0.63 mg  0.63 mg Nebulization Q6H PRN Shahmehdi, Seyed A, MD      . levETIRAcetam (KEPPRA) tablet 1,500 mg  1,500 mg Oral BID Hosie Poisson, MD   1,500 mg at 12/13/18 1108  . magnesium citrate solution 1 Bottle  1 Bottle Oral Once PRN Shahmehdi, Seyed A, MD      . midazolam (VERSED) injection 2 mg  2 mg Intravenous Q2H PRN Anders Simmonds, MD   2 mg at 12/13/18 2831  . multivitamin with minerals tablet 1 tablet  1 tablet Oral Daily Deatra James, MD   1 tablet at 12/13/18 0903  . mupirocin ointment (BACTROBAN) 2 % 1 application  1 application Nasal  BID Hosie Poisson, MD      . ondansetron [facility]) tablet 4 mg  4 mg Oral Q6H PRN Shahmehdi, Seyed A, MD       Or  . ondansetron (ZOFRAN) injection 4 mg  4 mg Intravenous Q6H PRN Shahmehdi, Seyed A, MD      . potassium chloride 10 mEq in 100 mL IVPB  10 mEq Intravenous Once Milton Ferguson, MD      . pyridOXINE (B-6) 100 mg in sodium chloride 0.9 % 50 mL IVPB  100 mg Intravenous Daily Deatra James, MD   Stopped at 12/13/18 0950  . senna-docusate (Senokot-S) tablet 1 tablet  1 tablet Oral QHS PRN Shahmehdi, Seyed A, MD      . sodium chloride flush (NS) 0.9 % injection 3 mL  3 mL Intravenous Q12H Shahmehdi, Seyed A, MD   3 mL at 12/13/18 0904  . sorbitol 70 % solution 30 mL  30 mL Oral Daily PRN Shahmehdi, Seyed A, MD      . thiamine (B-1) injection 100 mg  100 mg Intravenous Daily Collene Gobble, MD   100 mg at 12/13/18 3976    Musculoskeletal: Strength & Muscle Tone: No atrophy noted. Gait & Station: UTA since patient is lying in bed. Patient leans: N/A  Psychiatric Specialty Exam: Physical Exam  Nursing note and vitals reviewed. Constitutional: She is oriented to person, place, and time. She appears well-developed and well-nourished.  HENT:  Head: Normocephalic and atraumatic.  Neck: Normal range of motion.   Respiratory: Effort normal.  Musculoskeletal: Normal range of motion.  Neurological: She is alert and oriented to person, place, and time.  Psychiatric: She has a normal mood and affect. Her speech is normal and behavior is normal. Judgment and thought content normal. Cognition and memory are normal.    Review of Systems  Gastrointestinal: Negative for constipation, diarrhea, nausea and vomiting.  Psychiatric/Behavioral: Positive for depression and substance abuse. Negative for hallucinations and suicidal ideas.  All other systems reviewed and are negative.   Blood pressure (!) 140/91, pulse 81, temperature 98 F (36.7 C), temperature source Oral, resp. rate 18, height 5\' 4"  (1.626 m), weight 59.7 kg, SpO2 98 %.Body mass index is 22.59 kg/m.  General Appearance: Fairly Groomed, young, Caucasian female, wearing a hospital gown with short hair who is lying in bed. NAD.   Eye Contact:  Good  Speech:  Clear and Coherent and Normal Rate  Volume:  Normal  Mood:  Depressed  Affect:  Constricted  Thought Process:  Goal Directed, Linear and Descriptions of Associations: Intact  Orientation:  Full (Time, Place, and Person)  Thought Content:  Logical  Suicidal Thoughts:  No  Homicidal Thoughts:  No  Memory:  Immediate;   Good Recent;   Good Remote;   Good  Judgement:  Fair  Insight:  Fair  Psychomotor Activity:  Normal  Concentration:  Concentration: Good and Attention Span: Good  Recall:  Good  Fund of Knowledge:  Good  Language:  Good  Akathisia:  No  Handed:  Right  AIMS (if indicated):   N/A  Assets:  Communication Skills Desire for Improvement Housing Resilience Social Support  ADL's:  Intact  Cognition:  WNL  Sleep:   Poor   Assessment:  Haley Olsen is a 28 y.o. female who was admitted with acute encephalopathy in the setting of illicit substance use. She endorses mood lability/irritability with poor appetite and sleep. She minimizes her substance use and  appears to be  medication seeking. She denies SI, HI or AVH. She does not appear to be responding to internal stimuli. She is agreeable to restarting Celexa which has been helpful for mood in the past.   Treatment Plan Summary: -Start Celexa 20 mg daily for mood. -Start Trazodone 50 mg qhs PRN for insomnia.  -EKG reviewed and QTc 476 on 7/19. Please closely monitor when starting or increasing QTc prolonging agents.   -Please have SW provide patient with outpatient resources for substance abuse treatment and medication management.  -Psychiatry will sign off on patient at this time. Please consult psychiatry again as needed.   Disposition: No evidence of imminent risk to self or others at present.   Patient does not meet criteria for psychiatric inpatient admission.  This service was provided via telemedicine using a 2-way, interactive audio and video technology.  Names of all persons participating in this telemedicine service and their role in this encounter. Name: Buford Dresser, DO Role: Psychiatrist   Name: Haley Olsen Role: Patient     Faythe Dingwall, DO 12/13/2018 1:48 PM
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
## 2018-12-14 ENCOUNTER — Inpatient Hospital Stay (HOSPITAL_COMMUNITY): Payer: Self-pay

## 2018-12-14 LAB — ETHYLENE GLYCOL: Ethylene Glycol Lvl: <5 mg/dL

## 2018-12-14 LAB — GLUCOSE, CAPILLARY
Glucose-Capillary: 100 mg/dL — ABNORMAL HIGH (ref 70–99)
Glucose-Capillary: 119 mg/dL — ABNORMAL HIGH (ref 70–99)
Glucose-Capillary: 98 mg/dL (ref 70–99)

## 2018-12-14 LAB — BASIC METABOLIC PANEL
Anion gap: 11 (ref 5–15)
Anion gap: 10 (ref 5–15)
BUN: <5 mg/dL — ABNORMAL LOW (ref 6–20)
BUN: <5 mg/dL — ABNORMAL LOW (ref 6–20)
CO2: 21 mmol/L — ABNORMAL LOW (ref 22–32)
CO2: 21 mmol/L — ABNORMAL LOW (ref 22–32)
Calcium: 8.2 mg/dL — ABNORMAL LOW (ref 8.9–10.3)
Calcium: 9 mg/dL (ref 8.9–10.3)
Chloride: 108 mmol/L (ref 98–111)
Chloride: 109 mmol/L (ref 98–111)
Creatinine, Ser: 0.49 mg/dL (ref 0.44–1.00)
Creatinine, Ser: 0.53 mg/dL (ref 0.44–1.00)
GFR calc Af Amer: >60 mL/min (ref >60)
GFR calc Af Amer: >60 mL/min (ref >60)
GFR calc non Af Amer: >60 mL/min (ref >60)
GFR calc non Af Amer: >60 mL/min (ref >60)
Glucose, Bld: 113 mg/dL — ABNORMAL HIGH (ref 70–99)
Glucose, Bld: 105 mg/dL — ABNORMAL HIGH (ref 70–99)
Potassium: 2.6 mmol/L — CL (ref 3.5–5.1)
Potassium: 3.6 mmol/L (ref 3.5–5.1)
Sodium: 140 mmol/L (ref 135–145)
Sodium: 140 mmol/L (ref 135–145)

## 2018-12-14 LAB — CBC
HCT: 34.7 % — ABNORMAL LOW (ref 36.0–46.0)
Hemoglobin: 11.2 g/dL — ABNORMAL LOW (ref 12.0–15.0)
MCH: 31 pg (ref 26.0–34.0)
MCHC: 32.3 g/dL (ref 30.0–36.0)
MCV: 96.1 fL (ref 80.0–100.0)
Platelets: 171 10*3/uL (ref 150–400)
RBC: 3.61 MIL/uL — ABNORMAL LOW (ref 3.87–5.11)
RDW: 13.2 % (ref 11.5–15.5)
WBC: 5.1 10*3/uL (ref 4.0–10.5)
nRBC: 0 % (ref 0.0–0.2)

## 2018-12-14 LAB — T3, FREE: T3, Free: 3.9 pg/mL (ref 2.0–4.4)

## 2018-12-14 LAB — URINE CULTURE: Culture: NO GROWTH

## 2018-12-14 LAB — MAGNESIUM: Magnesium: 1.8 mg/dL (ref 1.7–2.4)

## 2018-12-14 LAB — LEVETIRACETAM LEVEL: Levetiracetam Lvl: 3.3 ug/mL — ABNORMAL LOW (ref 10.0–40.0)

## 2018-12-14 LAB — HIV ANTIBODY (ROUTINE TESTING W REFLEX): HIV Screen 4th Generation wRfx: NONREACTIVE

## 2018-12-14 MED ORDER — POTASSIUM CHLORIDE CRYS ER 20 MEQ PO TBCR
40.0000 meq | EXTENDED_RELEASE_TABLET | Freq: Two times a day (BID) | ORAL | Status: DC
  Administered 2018-12-14 – 2018-12-16 (×5): 40 meq via ORAL
  Filled 2018-12-14 (×5): qty 2

## 2018-12-14 MED ORDER — LACOSAMIDE 50 MG PO TABS
50.0000 mg | ORAL_TABLET | Freq: Two times a day (BID) | ORAL | Status: DC
  Administered 2018-12-14 – 2018-12-16 (×4): 50 mg via ORAL
  Filled 2018-12-14 (×4): qty 1

## 2018-12-14 MED ORDER — POTASSIUM CHLORIDE 10 MEQ/100ML IV SOLN
10.0000 meq | INTRAVENOUS | Status: AC
  Administered 2018-12-14 (×2): 10 meq via INTRAVENOUS
  Filled 2018-12-14 (×2): qty 100

## 2018-12-14 MED ORDER — POTASSIUM CHLORIDE 10 MEQ/100ML IV SOLN
INTRAVENOUS | Status: AC
  Filled 2018-12-14: qty 100

## 2018-12-14 MED ORDER — MAGNESIUM OXIDE 400 (241.3 MG) MG PO TABS
400.0000 mg | ORAL_TABLET | Freq: Two times a day (BID) | ORAL | Status: DC
  Administered 2018-12-14 – 2018-12-16 (×4): 400 mg via ORAL
  Filled 2018-12-14 (×4): qty 1

## 2018-12-14 MED ORDER — HYDRALAZINE HCL 20 MG/ML IJ SOLN
5.0000 mg | Freq: Four times a day (QID) | INTRAMUSCULAR | Status: DC | PRN
  Administered 2018-12-15: 5 mg via INTRAVENOUS
  Filled 2018-12-14 (×2): qty 1

## 2018-12-14 MED ORDER — LORAZEPAM 2 MG/ML IJ SOLN
INTRAMUSCULAR | Status: AC
  Filled 2018-12-14: qty 1

## 2018-12-14 MED ORDER — LORAZEPAM 2 MG/ML IJ SOLN
1.0000 mg | Freq: Once | INTRAMUSCULAR | Status: AC
  Administered 2018-12-14: 2 mg via INTRAVENOUS

## 2018-12-14 NOTE — Progress Notes (Signed)
Text paged MD to notify of critical potassium lab value of 2.6 reported to RN by lab.
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
## 2018-12-15 DIAGNOSIS — M79671 Pain in right foot: Secondary | ICD-10-CM

## 2018-12-15 LAB — GLUCOSE, CAPILLARY
Glucose-Capillary: 99 mg/dL (ref 70–99)
Glucose-Capillary: 100 mg/dL — ABNORMAL HIGH (ref 70–99)
Glucose-Capillary: 111 mg/dL — ABNORMAL HIGH (ref 70–99)
Glucose-Capillary: 98 mg/dL (ref 70–99)
Glucose-Capillary: 98 mg/dL (ref 70–99)

## 2018-12-15 LAB — VOLATILES,BLD-ACETONE,ETHANOL,ISOPROP,METHANOL
Acetone, blood: 0.02 % — ABNORMAL HIGH (ref 0.000–0.010)
Ethanol, blood: NEGATIVE % (ref 0.000–0.010)
Isopropanol, blood: NEGATIVE % (ref 0.000–0.010)
Methanol, blood: NEGATIVE % (ref 0.000–0.010)

## 2018-12-15 MED ORDER — INSULIN ASPART 100 UNIT/ML ~~LOC~~ SOLN: 0.0000 [IU] | Freq: Three times a day (TID) | SUBCUTANEOUS | Status: DC

## 2018-12-15 MED ORDER — ENSURE ENLIVE PO LIQD: 237.0000 mL | ORAL | Status: DC
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
## 2018-12-15 NOTE — Plan of Care (Signed)
Patient tolerating regular diet without issue.  Continues to c/o pain in right foot, medicated with Toradol x 2 this shift with minimal improvement.  Patient did complain of anxiety but was very cooperative with care and pleasant to staff.
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
## 2018-12-16 DIAGNOSIS — F1910 Other psychoactive substance abuse, uncomplicated: Secondary | ICD-10-CM

## 2018-12-16 DIAGNOSIS — G9340 Encephalopathy, unspecified: Secondary | ICD-10-CM

## 2018-12-16 DIAGNOSIS — E876 Hypokalemia: Secondary | ICD-10-CM

## 2018-12-16 LAB — BASIC METABOLIC PANEL
Anion gap: 7 (ref 5–15)
BUN: 6 mg/dL (ref 6–20)
CO2: 24 mmol/L (ref 22–32)
Calcium: 9.2 mg/dL (ref 8.9–10.3)
Chloride: 109 mmol/L (ref 98–111)
Creatinine, Ser: 0.61 mg/dL (ref 0.44–1.00)
GFR calc Af Amer: >60 mL/min (ref >60)
GFR calc non Af Amer: >60 mL/min (ref >60)
Glucose, Bld: 100 mg/dL — ABNORMAL HIGH (ref 70–99)
Potassium: 4.1 mmol/L (ref 3.5–5.1)
Sodium: 140 mmol/L (ref 135–145)

## 2018-12-16 LAB — CBC
HCT: 35.1 % — ABNORMAL LOW (ref 36.0–46.0)
Hemoglobin: 11.7 g/dL — ABNORMAL LOW (ref 12.0–15.0)
MCH: 30.6 pg (ref 26.0–34.0)
MCHC: 33.3 g/dL (ref 30.0–36.0)
MCV: 91.9 fL (ref 80.0–100.0)
Platelets: 208 10*3/uL (ref 150–400)
RBC: 3.82 MIL/uL — ABNORMAL LOW (ref 3.87–5.11)
RDW: 13.2 % (ref 11.5–15.5)
WBC: 5.3 10*3/uL (ref 4.0–10.5)
nRBC: 0 % (ref 0.0–0.2)

## 2018-12-16 LAB — MAGNESIUM: Magnesium: 1.9 mg/dL (ref 1.7–2.4)

## 2018-12-16 LAB — GLUCOSE, CAPILLARY
Glucose-Capillary: 95 mg/dL (ref 70–99)
Glucose-Capillary: 134 mg/dL — ABNORMAL HIGH (ref 70–99)

## 2018-12-16 LAB — URIC ACID: Uric Acid, Serum: 2.7 mg/dL (ref 2.5–7.1)

## 2018-12-16 MED ORDER — ADULT MULTIVITAMIN W/MINERALS CH: 1.0000 | ORAL_TABLET | Freq: Every day | ORAL | 0 refills | Status: DC

## 2018-12-16 MED ORDER — LEVETIRACETAM 750 MG PO TABS: 1500.0000 mg | ORAL_TABLET | Freq: Two times a day (BID) | ORAL | 0 refills | Status: DC

## 2018-12-16 MED ORDER — FOLIC ACID 1 MG PO TABS: 1.0000 mg | ORAL_TABLET | Freq: Every day | ORAL | 0 refills | Status: DC

## 2018-12-16 MED ORDER — VITAMIN B-6 50 MG PO TABS: 50.0000 mg | ORAL_TABLET | Freq: Every day | ORAL | 0 refills | Status: DC

## 2018-12-16 MED ORDER — CITALOPRAM HYDROBROMIDE 20 MG PO TABS: 20.0000 mg | ORAL_TABLET | Freq: Every day | ORAL | 0 refills | Status: DC

## 2018-12-16 MED ORDER — VITAMIN B-1 100 MG PO TABS: 100.0000 mg | ORAL_TABLET | Freq: Every day | ORAL | Status: DC

## 2018-12-16 MED ORDER — FOLIC ACID 1 MG PO TABS: 1.0000 mg | ORAL_TABLET | Freq: Every day | ORAL | Status: DC

## 2018-12-16 MED ORDER — THIAMINE HCL 100 MG PO TABS: 100.0000 mg | ORAL_TABLET | Freq: Every day | ORAL | 0 refills | Status: DC

## 2018-12-16 MED ORDER — TRAZODONE HCL 50 MG PO TABS: 50.0000 mg | ORAL_TABLET | Freq: Every day | ORAL | 0 refills | Status: DC

## 2018-12-16 NOTE — Progress Notes (Signed)
PHARMACIST - PHYSICIAN COMMUNICATION   CONCERNING: IV to Oral Route Change Policy  RECOMMENDATION: This patient is receiving thiamine and folic acid by the intravenous route.  Based on criteria approved by the Pharmacy and Therapeutics Committee, the intravenous medication(s) is/are being converted to the equivalent oral dose form(s).   DESCRIPTION: These criteria include:  The patient is eating (either orally or via tube) and/or has been taking other orally administered medications for a least 24 hours  The patient has no evidence of active gastrointestinal bleeding or impaired GI absorption (gastrectomy, short bowel, patient on TNA or NPO).  If you have questions about this conversion, please contact the Pharmacy Department  []   [phone] )  [facility] []   [phone] )  [facility] []   [phone] )  [facility] []   [phone] )  [facility] [x]   [phone] )  [place], [place], [facility] 12/16/2018 12:10 PM

## 2018-12-16 NOTE — Progress Notes (Signed)
Spoke with pt's father Mr. Godette concerning HHPT with [facility] and Financial aid. Mr. Bodner thanks everyone who has taken care of his daughter.
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
## 2018-12-16 NOTE — Progress Notes (Signed)
Alvis Lemmings will follow for HHPT when pt discharge.

## 2018-12-16 NOTE — Progress Notes (Signed)
Information for OP Mental Health Providers given to pt. Pt's father states that pt has a walker at home and will be able to purchase medications on a $4.00 list.

## 2018-12-20 ENCOUNTER — Telehealth: Payer: Self-pay

## 2018-12-20 NOTE — Telephone Encounter (Signed)
I LM on voice mail to please call back and ask for me to discuss hospital follow up plans for patient.

## 2018-12-22 NOTE — Telephone Encounter (Signed)
Left message for patient or her father to call back to follow up on patient and make plans for follow up

## 2018-12-22 NOTE — Telephone Encounter (Signed)
Spoke with patient. Patient is doing much better. She has more feeling in her feet and the pain is better. She is able to walk better. She is not set up with P.T. She does not have insurance coverage still. Social worker is working with her on getting Medicaid but it can take up to 30 days or more. Patient will discuss with her father about been able to come in for hospital follow up and call back. Spoke with Leafy Ro and would like to get patient with Dr. Damita Dunnings if possible.

## 2019-03-24 ENCOUNTER — Telehealth: Payer: Self-pay

## 2019-03-24 NOTE — Telephone Encounter (Signed)
-----  Message from Lesleigh Noe, MD sent at 03/23/2019  3:06 PM EDT ----- Regarding: follow-up Joleigh Mineau,   Can you reach out to this patient to schedule a follow-up? It looks like she had not been seen since we met.   Janett Billow

## 2019-03-24 NOTE — Telephone Encounter (Signed)
Called both numbers in the chart and they were out of service. I called patient's mom and found out that patient went to [place], [place] about 3 weeks ago. Staying with a family member, she does not have a working phone on her that they know of. Mom stated they have not spoken to the patient since she has been gone. They are not sure if she is coming back to [place] or not. As far as they know she is doing ok and has not been at the hospital since last time in our record. Advised mom that if she speaks with patient to encourage her to follow up with a provider/neurologist to address her health issues and make sure she does not get sicker. Mom verbalized understanding. FYI to Dr Einar Pheasant

## 2019-08-07 ENCOUNTER — Emergency Department
Admission: EM | Admit: 2019-08-07 | Discharge: 2019-08-07 | Disposition: A | Discharge status: Home / Self Care | Payer: Self-pay | Attending: Student | Admitting: Student

## 2019-08-07 ENCOUNTER — Other Ambulatory Visit: Payer: Self-pay

## 2019-08-07 ENCOUNTER — Emergency Department: Payer: Self-pay

## 2019-08-07 DIAGNOSIS — L0291 Cutaneous abscess, unspecified: Secondary | ICD-10-CM

## 2019-08-07 DIAGNOSIS — N611 Abscess of the breast and nipple: Secondary | ICD-10-CM | POA: Insufficient documentation

## 2019-08-07 DIAGNOSIS — Z7984 Long term (current) use of oral hypoglycemic drugs: Secondary | ICD-10-CM | POA: Insufficient documentation

## 2019-08-07 DIAGNOSIS — E119 Type 2 diabetes mellitus without complications: Secondary | ICD-10-CM | POA: Insufficient documentation

## 2019-08-07 DIAGNOSIS — F17210 Nicotine dependence, cigarettes, uncomplicated: Secondary | ICD-10-CM | POA: Insufficient documentation

## 2019-08-07 LAB — COMPREHENSIVE METABOLIC PANEL
ALT: 24 U/L (ref 0–44)
AST: 27 U/L (ref 15–41)
Albumin: 4 g/dL (ref 3.5–5.0)
Alkaline Phosphatase: 88 U/L (ref 38–126)
Anion gap: 10 (ref 5–15)
BUN: 14 mg/dL (ref 6–20)
CO2: 23 mmol/L (ref 22–32)
Calcium: 9 mg/dL (ref 8.9–10.3)
Chloride: 104 mmol/L (ref 98–111)
Creatinine, Ser: 0.72 mg/dL (ref 0.44–1.00)
GFR calc Af Amer: >60 mL/min (ref >60)
GFR calc non Af Amer: >60 mL/min (ref >60)
Glucose, Bld: 90 mg/dL (ref 70–99)
Potassium: 4 mmol/L (ref 3.5–5.1)
Sodium: 137 mmol/L (ref 135–145)
Total Bilirubin: 0.6 mg/dL (ref 0.3–1.2)
Total Protein: 7.5 g/dL (ref 6.5–8.1)

## 2019-08-07 LAB — CBC WITH DIFFERENTIAL/PLATELET
Abs Immature Granulocytes: 0.04 10*3/uL (ref 0.00–0.07)
Basophils Absolute: 0 10*3/uL (ref 0.0–0.1)
Basophils Relative: 0 %
Eosinophils Absolute: 0.1 10*3/uL (ref 0.0–0.5)
Eosinophils Relative: 1 %
HCT: 42.1 % (ref 36.0–46.0)
Hemoglobin: 13.7 g/dL (ref 12.0–15.0)
Immature Granulocytes: 0 %
Lymphocytes Relative: 22 %
Lymphs Abs: 2.8 10*3/uL (ref 0.7–4.0)
MCH: 31.2 pg (ref 26.0–34.0)
MCHC: 32.5 g/dL (ref 30.0–36.0)
MCV: 95.9 fL (ref 80.0–100.0)
Monocytes Absolute: 0.8 10*3/uL (ref 0.1–1.0)
Monocytes Relative: 6 %
Neutro Abs: 9.2 10*3/uL — ABNORMAL HIGH (ref 1.7–7.7)
Neutrophils Relative %: 71 %
Platelets: 300 10*3/uL (ref 150–400)
RBC: 4.39 MIL/uL (ref 3.87–5.11)
RDW: 13.7 % (ref 11.5–15.5)
WBC: 13 10*3/uL — ABNORMAL HIGH (ref 4.0–10.5)
nRBC: 0 % (ref 0.0–0.2)

## 2019-08-07 MED ORDER — CLINDAMYCIN HCL 300 MG PO CAPS: 300.0000 mg | ORAL_CAPSULE | Freq: Three times a day (TID) | ORAL | 0 refills | Status: DC

## 2019-08-07 MED ORDER — CLINDAMYCIN PHOSPHATE 600 MG/50ML IV SOLN
600.0000 mg | Freq: Once | INTRAVENOUS | Status: AC
  Administered 2019-08-07: 17:00:00 600 mg via INTRAVENOUS
  Filled 2019-08-07: qty 50

## 2019-08-07 MED ORDER — ACETAMINOPHEN 325 MG PO TABS
650.0000 mg | ORAL_TABLET | Freq: Once | ORAL | Status: AC
  Administered 2019-08-07: 18:00:00 650 mg via ORAL
  Filled 2019-08-07: qty 2

## 2019-08-07 MED ORDER — CLINDAMYCIN HCL 300 MG PO CAPS: 300.0000 mg | ORAL_CAPSULE | Freq: Three times a day (TID) | ORAL | 0 refills | Status: AC

## 2019-08-07 NOTE — ED Triage Notes (Signed)
Pt comes POV with abscess to her left breast. Pt has nipples pierced and said it has happened before with an ingrown hair.

## 2019-08-07 NOTE — ED Provider Notes (Signed)
Emergency Department Provider Note  ____________________________________________  Time seen: Approximately 3:33 PM  I have reviewed the triage vital signs and the nursing notes.   HISTORY  Chief Complaint Abscess   Historian Patient    HPI Haley Olsen is a 29 y.o. female with a history of hepatitis, diabetes, substance use presents to the emergency department with a breast abscess of the left breast that involves the areola and nipple.  She has had symptoms for the past month.  She is unsure of fever or chills at home.  No nausea or vomiting.  She states that her symptoms have worsened over the past 2 to 3 days.  She had a history of cutaneous abscesses in the past.   Past Medical History:  Diagnosis Date  . Anxiety   . Arthritis   . Asthma   . Chlamydia   . Depression   . Diabetes mellitus without complication ([place])   . Drug addiction ([place])   . GERD (gastroesophageal reflux disease)   . Hepatitis C   . History of syphilis   . Migraine   . Seizures ([place])   . Stroke [facility])      Immunizations up to date:  Yes.     Past Medical History:  Diagnosis Date  . Anxiety   . Arthritis   . Asthma   . Chlamydia   . Depression   . Diabetes mellitus without complication ([place])   . Drug addiction ([place])   . GERD (gastroesophageal reflux disease)   . Hepatitis C   . History of syphilis   . Migraine   . Seizures ([place])   . Stroke [facility])     Patient Active Problem List   Diagnosis Date Noted  . Hypokalemia   . Acute encephalopathy   . Foot pain, right   . Polysubstance abuse ([place]) 12/13/2018  . Substance induced mood disorder ([place])   . Altered mental status 12/12/2018  . Drug abuse in remission ([place]) 12/08/2018  . Depression, recurrent ([place]) 12/08/2018  . Bipolar disorder ([place]) 12/08/2018  . Cerebrovascular accident (CVA) ([place]) 12/08/2018  . Elevated blood pressure reading 12/08/2018  . Type 2 diabetes mellitus without complication, without long-term  current use of insulin ([place]) 12/08/2018  . Hepatitis C   . Seizures ([place])   . Opioid use disorder, severe, dependence ([place]) 08/04/2016  . TOBACCO USER 03/03/2009  . ASTHMA, INTERMITTENT 07/23/2006    Past Surgical History:  Procedure Laterality Date  . NOSE SURGERY     reconstructive    Prior to Admission medications   Medication Sig Start Date End Date Taking? Authorizing Provider  citalopram (CELEXA) 20 MG tablet Take 1 tablet (20 mg total) by mouth daily. 12/17/18   Florencia Reasons, MD  clindamycin (CLEOCIN) 300 MG capsule Take 1 capsule (300 mg total) by mouth 3 (three) times daily for 10 days. 08/07/19 08/17/19  Lannie Fields, PA-C  folic acid (FOLVITE) 1 MG tablet Take 1 tablet (1 mg total) by mouth daily. 12/17/18   Florencia Reasons, MD  levETIRAcetam (KEPPRA) 750 MG tablet Take 2 tablets (1,500 mg total) by mouth 2 (two) times daily. 12/16/18   Florencia Reasons, MD  Multiple Vitamin (MULTIVITAMIN WITH MINERALS) TABS tablet Take 1 tablet by mouth daily. 12/17/18   Florencia Reasons, MD  pyridOXINE (VITAMIN B-6) 50 MG tablet Take 1 tablet (50 mg total) by mouth daily. 12/16/18   Florencia Reasons, MD  thiamine 100 MG tablet Take 1 tablet (100 mg total) by mouth daily. 12/17/18  Florencia Reasons, MD  traZODone (DESYREL) 50 MG tablet Take 1 tablet (50 mg total) by mouth at bedtime. 12/16/18   Florencia Reasons, MD    Allergies Wasp venom protein and Hydrocodone  Family History  Problem Relation Age of Onset  . Hyperlipidemia Maternal Grandmother   . Heart disease Maternal Grandmother   . Drug abuse Maternal Grandmother   . Diabetes Maternal Grandmother   . Depression Maternal Grandmother   . Asthma Maternal Grandmother   . Heart attack Maternal Grandmother 50  . Depression Mother   . Drug abuse Mother        in recovery  . Alcohol abuse Father   . Arthritis Father   . Depression Father   . Drug abuse Father   . Hearing loss Father   . Diabetes Maternal Grandfather   . Drug abuse Maternal Grandfather   . Heart attack Maternal  Grandfather   . Heart disease Maternal Grandfather   . Heart attack Paternal Grandmother 22  . Alcohol abuse Paternal Grandmother   . Depression Paternal Grandmother     Social History Social History   Tobacco Use  . Smoking status: Current Every Day Smoker    Packs/day: 0.25    Years: 12.00    Pack years: 3.00    Types: Cigarettes  . Smokeless tobacco: Never Used  . Tobacco comment: declined  Substance Use Topics  . Alcohol use: No  . Drug use: Not Currently    Types: IV, Marijuana, Heroin    Comment: heroine - Last IV drugs was 1 month ago     Review of Systems  Constitutional: No fever/chills Eyes:  No discharge ENT: No upper respiratory complaints. Respiratory: no cough. No SOB/ use of accessory muscles to breath Gastrointestinal:   No nausea, no vomiting.  No diarrhea.  No constipation. Musculoskeletal: Negative for musculoskeletal pain. Skin: Patient has abscess of left breast.     ____________________________________________   PHYSICAL EXAM:  VITAL SIGNS: ED Triage Vitals  Enc Vitals Group     BP 08/07/19 1452 (!) 144/92     Pulse Rate 08/07/19 1452 71     Resp 08/07/19 1452 18     Temp 08/07/19 1452 98.2 F (36.8 C)     Temp Source 08/07/19 1452 Oral     SpO2 08/07/19 1452 95 %     Weight 08/07/19 1449 127 lb 13.9 oz (58 kg)     Height 08/07/19 1449 5\' 3"  (1.6 m)     Head Circumference --      Peak Flow --      Pain Score 08/07/19 1453 6     Pain Loc --      Pain Edu? --      Excl. in [place]? --      Constitutional: Alert and oriented. Well appearing and in no acute distress. Eyes: Conjunctivae are normal. PERRL. EOMI. Head: Atraumatic. Cardiovascular: Normal rate, regular rhythm. Normal S1 and S2.  Good peripheral circulation. Respiratory: Normal respiratory effort without tachypnea or retractions. Lungs CTAB. Good air entry to the bases with no decreased or absent breath sounds Gastrointestinal: Bowel sounds x 4 quadrants. Soft and nontender  to palpation. No guarding or rigidity. No distention. Musculoskeletal: Full range of motion to all extremities. No obvious deformities noted Neurologic:  Normal for age. No gross focal neurologic deficits are appreciated.  Skin: Patient has a 4 cm x 4 cm region of circumferential erythema of left breast involving areola and nipple. Psychiatric: Mood and affect are  normal for age. Speech and behavior are normal.   ____________________________________________   LABS (all labs ordered are listed, but only abnormal results are displayed)  Labs Reviewed  CBC WITH DIFFERENTIAL/PLATELET - Abnormal; Notable for the following components:      Result Value   WBC 13.0 (*)    Neutro Abs 9.2 (*)    All other components within normal limits  SARS CORONAVIRUS 2 (TAT 6-24 HRS)  COMPREHENSIVE METABOLIC PANEL   ____________________________________________  EKG   ____________________________________________  RADIOLOGY Unk Pinto, personally viewed and evaluated these images (plain radiographs) as part of my medical decision making, as well as reviewing the written report by the radiologist.    No results found.  ____________________________________________    PROCEDURES  Procedure(s) performed:     Procedures     Medications  clindamycin (CLEOCIN) IVPB 600 mg (600 mg Intravenous New Bag/Given 08/07/19 1716)  acetaminophen (TYLENOL) tablet 650 mg (650 mg Oral Given 08/07/19 1758)     ____________________________________________   INITIAL IMPRESSION / ASSESSMENT AND PLAN / ED COURSE  Pertinent labs & imaging results that were available during my care of the patient were reviewed by me and considered in my medical decision making (see chart for details).    Assessment and Plan:  Breast Abscess  29 year old female presents to the emergency department with a breast abscess involving the left nipple that has been present for the past 3 to 4 weeks.  Patient was mildly  hypertensive at triage but vital signs were otherwise reassuring.  On physical exam, patient had a 4 cm x 4 cm region of erythema with palpable induration involving the nipple.  Patient will undergo breast ultrasound with basic labs.  Breast ultrasound revealed a left-sided retroareolar breast abscess.  IR was initially consulted who agreed to do needle aspiration with admission by general surgery.  Dr. Lysle Pearl personally evaluated patient and anticipated admitting patient for procedure.  Ultimately patient decided that she did not want to be admitted because she had work obligations.  Dr. Lysle Pearl advised patient to follow-up with him in the office as an outpatient.  She was given IV clindamycin in the emergency department and discharged with clindamycin.  Send off COVID-19 testing is in process at this time.    ____________________________________________  FINAL CLINICAL IMPRESSION(S) / ED DIAGNOSES  Final diagnoses:  Abscess  Breast abscess      NEW MEDICATIONS STARTED DURING THIS VISIT:  ED Discharge Orders         Ordered    clindamycin (CLEOCIN) 300 MG capsule  3 times daily     08/07/19 1745              This chart was dictated using voice recognition software/Dragon. Despite best efforts to proofread, errors can occur which can change the meaning. Any change was purely unintentional.     Lannie Fields, PA-C 08/07/19 1808    Lilia Pro., MD 08/08/19 [phone]

## 2019-08-07 NOTE — Discharge Instructions (Signed)
Take clindamycin 3 times a day for the next 10 days. Please make an appointment with Dr. Lysle Pearl soon as possible.

## 2019-08-07 NOTE — ED Notes (Signed)
Attempted IV x2 without success. Large amounts of scar tissue noted. Pt states she is a hard stick.

## 2019-08-12 ENCOUNTER — Other Ambulatory Visit: Payer: Self-pay

## 2019-08-12 ENCOUNTER — Inpatient Hospital Stay
Admission: EM | Admit: 2019-08-12 | Discharge: 2019-08-15 | DRG: 600 | Disposition: A | Discharge status: Home / Self Care | Payer: Self-pay | Attending: Hospitalist | Admitting: Hospitalist

## 2019-08-12 DIAGNOSIS — F1111 Opioid abuse, in remission: Secondary | ICD-10-CM | POA: Diagnosis present

## 2019-08-12 DIAGNOSIS — Z8249 Family history of ischemic heart disease and other diseases of the circulatory system: Secondary | ICD-10-CM

## 2019-08-12 DIAGNOSIS — L0291 Cutaneous abscess, unspecified: Secondary | ICD-10-CM

## 2019-08-12 DIAGNOSIS — Z813 Family history of other psychoactive substance abuse and dependence: Secondary | ICD-10-CM

## 2019-08-12 DIAGNOSIS — Z8673 Personal history of transient ischemic attack (TIA), and cerebral infarction without residual deficits: Secondary | ICD-10-CM

## 2019-08-12 DIAGNOSIS — F17210 Nicotine dependence, cigarettes, uncomplicated: Secondary | ICD-10-CM | POA: Diagnosis present

## 2019-08-12 DIAGNOSIS — Z833 Family history of diabetes mellitus: Secondary | ICD-10-CM

## 2019-08-12 DIAGNOSIS — Z20822 Contact with and (suspected) exposure to covid-19: Secondary | ICD-10-CM | POA: Diagnosis present

## 2019-08-12 DIAGNOSIS — L03313 Cellulitis of chest wall: Secondary | ICD-10-CM | POA: Diagnosis present

## 2019-08-12 DIAGNOSIS — N611 Abscess of the breast and nipple: Principal | ICD-10-CM | POA: Diagnosis present

## 2019-08-12 DIAGNOSIS — Z8261 Family history of arthritis: Secondary | ICD-10-CM

## 2019-08-12 DIAGNOSIS — F329 Major depressive disorder, single episode, unspecified: Secondary | ICD-10-CM | POA: Diagnosis present

## 2019-08-12 DIAGNOSIS — F1290 Cannabis use, unspecified, uncomplicated: Secondary | ICD-10-CM | POA: Diagnosis present

## 2019-08-12 DIAGNOSIS — Z79899 Other long term (current) drug therapy: Secondary | ICD-10-CM

## 2019-08-12 DIAGNOSIS — F419 Anxiety disorder, unspecified: Secondary | ICD-10-CM | POA: Diagnosis present

## 2019-08-12 DIAGNOSIS — Z818 Family history of other mental and behavioral disorders: Secondary | ICD-10-CM

## 2019-08-12 DIAGNOSIS — Z825 Family history of asthma and other chronic lower respiratory diseases: Secondary | ICD-10-CM

## 2019-08-12 DIAGNOSIS — E872 Acidosis, unspecified: Secondary | ICD-10-CM | POA: Diagnosis present

## 2019-08-12 DIAGNOSIS — Z9114 Patient's other noncompliance with medication regimen: Secondary | ICD-10-CM

## 2019-08-12 DIAGNOSIS — F1110 Opioid abuse, uncomplicated: Secondary | ICD-10-CM | POA: Diagnosis present

## 2019-08-12 DIAGNOSIS — Z8349 Family history of other endocrine, nutritional and metabolic diseases: Secondary | ICD-10-CM

## 2019-08-12 DIAGNOSIS — Z9119 Patient's noncompliance with other medical treatment and regimen: Secondary | ICD-10-CM

## 2019-08-12 DIAGNOSIS — F339 Major depressive disorder, recurrent, unspecified: Secondary | ICD-10-CM | POA: Diagnosis present

## 2019-08-12 DIAGNOSIS — E119 Type 2 diabetes mellitus without complications: Secondary | ICD-10-CM

## 2019-08-12 DIAGNOSIS — G4700 Insomnia, unspecified: Secondary | ICD-10-CM | POA: Diagnosis present

## 2019-08-12 DIAGNOSIS — R569 Unspecified convulsions: Secondary | ICD-10-CM

## 2019-08-12 DIAGNOSIS — Z811 Family history of alcohol abuse and dependence: Secondary | ICD-10-CM

## 2019-08-12 DIAGNOSIS — J45909 Unspecified asthma, uncomplicated: Secondary | ICD-10-CM | POA: Diagnosis present

## 2019-08-12 DIAGNOSIS — F1010 Alcohol abuse, uncomplicated: Secondary | ICD-10-CM | POA: Diagnosis present

## 2019-08-12 MED ORDER — CLINDAMYCIN PHOSPHATE 600 MG/50ML IV SOLN
600.0000 mg | Freq: Once | INTRAVENOUS | Status: AC
  Administered 2019-08-13: 600 mg via INTRAVENOUS
  Filled 2019-08-12: qty 50

## 2019-08-12 NOTE — ED Provider Notes (Signed)
[facility] Emergency Department Provider Note   ____________________________________________   First MD Initiated Contact with Patient 08/12/19 2308     (approximate)  I have reviewed the triage vital signs and the nursing notes.   HISTORY  Chief Complaint fluid left breast    HPI Haley Olsen is a 29 y.o. female who returns to the ED from home for admission for left breast abscess.  Patient was seen in the ED on 3/14 and found to have 4 x 4 centimeter left retroareolar breast abscess.  She was seen by surgery Dr. Lysle Pearl with the plan to admit for IR aspiration.  However, patient was unable to stay due to work commitments.  She did not fill the prescription for clindamycin and did not follow-up as instructed with surgery.  Patient returns tonight due to increased pain and size of abscess.  Denies fever, chills, cough, shortness of breath, abdominal pain, nausea or vomiting.  She is noncompliant with all medications for diabetes, stroke and seizures.  Admits to [facility] use.  States she has not used IV drugs in 2 months.       Past Medical History:  Diagnosis Date  . Anxiety   . Arthritis   . Asthma   . Chlamydia   . Depression   . Diabetes mellitus without complication ([place])   . Drug addiction ([place])   . GERD (gastroesophageal reflux disease)   . Hepatitis C   . History of syphilis   . Migraine   . Seizures ([place])   . Stroke [facility])     Patient Active Problem List   Diagnosis Date Noted  . Abscess of left breast 08/13/2019  . Excessive drinking of alcohol 08/13/2019  . History of CVA (cerebrovascular accident) 08/13/2019  . Left breast abscess 08/13/2019  . Hypokalemia   . Acute encephalopathy   . Foot pain, right   . Polysubstance abuse ([place]) 12/13/2018  . Substance induced mood disorder ([place])   . Altered mental status 12/12/2018  . History of heroin abuse ([place]) 12/08/2018  . Depression, recurrent ([place]) 12/08/2018  . Bipolar disorder  ([place]) 12/08/2018  . Cerebrovascular accident (CVA) ([place]) 12/08/2018  . Elevated blood pressure reading 12/08/2018  . Type 2 diabetes mellitus without complication, without long-term current use of insulin ([place]) 12/08/2018  . Hepatitis C   . Seizures ([place])   . Opioid use disorder, severe, dependence ([employer]) 08/04/2016  . TOBACCO USER 03/03/2009  . ASTHMA, INTERMITTENT 07/23/2006    Past Surgical History:  Procedure Laterality Date  . NOSE SURGERY     reconstructive    Prior to Admission medications   Medication Sig Start Date End Date Taking? Authorizing Provider  clindamycin (CLEOCIN) 300 MG capsule Take 1 capsule (300 mg total) by mouth 3 (three) times daily for 10 days. Patient not taking: Reported on 08/13/2019 08/07/19 08/17/19  Lannie Fields, PA-C    Allergies Wasp venom protein and Hydrocodone  Family History  Problem Relation Age of Onset  . Hyperlipidemia Maternal Grandmother   . Heart disease Maternal Grandmother   . Drug abuse Maternal Grandmother   . Diabetes Maternal Grandmother   . Depression Maternal Grandmother   . Asthma Maternal Grandmother   . Heart attack Maternal Grandmother 50  . Depression Mother   . Drug abuse Mother        in recovery  . Alcohol abuse Father   . Arthritis Father   . Depression Father   . Drug abuse Father   .  Hearing loss Father   . Diabetes Maternal Grandfather   . Drug abuse Maternal Grandfather   . Heart attack Maternal Grandfather   . Heart disease Maternal Grandfather   . Heart attack Paternal Grandmother 83  . Alcohol abuse Paternal Grandmother   . Depression Paternal Grandmother     Social History Social History   Tobacco Use  . Smoking status: Current Every Day Smoker    Packs/day: 0.25    Years: 12.00    Pack years: 3.00    Types: Cigarettes  . Smokeless tobacco: Never Used  . Tobacco comment: declined  Substance Use Topics  . Alcohol use: Yes    Comment: daily  . Drug use: Not Currently    Types: IV,  Marijuana, Heroin    Comment: heroine - Last IV drugs was 1 month ago, adderall    Review of Systems  Constitutional: No fever/chills Eyes: No visual changes. ENT: No sore throat. Cardiovascular: Denies chest pain. Respiratory: Denies shortness of breath. Gastrointestinal: No abdominal pain.  No nausea, no vomiting.  No diarrhea.  No constipation. Genitourinary: Negative for dysuria. Musculoskeletal: Negative for back pain. Skin: Positive for left breast abscess.  Negative for rash. Neurological: Negative for headaches, focal weakness or numbness.   ____________________________________________   PHYSICAL EXAM:  VITAL SIGNS: ED Triage Vitals  Enc Vitals Group     BP 08/12/19 1739 131/87     Pulse Rate 08/12/19 1739 (!) 114     Resp 08/12/19 1739 18     Temp 08/12/19 1739 (!) 97.5 F (36.4 C)     Temp Source 08/12/19 1739 Oral     SpO2 08/12/19 1739 97 %     Weight 08/12/19 1734 132 lb (59.9 kg)     Height 08/12/19 1734 5\' 4"  (1.626 m)     Head Circumference --      Peak Flow --      Pain Score 08/12/19 1734 7     Pain Loc --      Pain Edu? --      Excl. in [place]? --     Constitutional: Alert and oriented. Well appearing and in no acute distress. Eyes: Conjunctivae are normal. PERRL. EOMI. Head: Atraumatic. Nose: No congestion/rhinnorhea. Mouth/Throat: Mucous membranes are moist.  Oropharynx non-erythematous. Neck: No stridor.   Cardiovascular: Normal rate, regular rhythm. Grossly normal heart sounds.  Good peripheral circulation. Respiratory: Normal respiratory effort.  No retractions. Lungs CTAB. Gastrointestinal: Soft and nontender. No distention. No abdominal bruits. No CVA tenderness. Musculoskeletal: No lower extremity tenderness nor edema.  No joint effusions. Neurologic:  Normal speech and language. No gross focal neurologic deficits are appreciated. No gait instability. Skin:  Skin is warm, dry and intact. No rash noted.  Approximately 6 x 4 cm area of  redness, warmth and induration to left areola and above. Psychiatric: Mood and affect are normal. Speech and behavior are normal.  ____________________________________________   LABS (all labs ordered are listed, but only abnormal results are displayed)  Labs Reviewed  URINE DRUG SCREEN, QUALITATIVE ([facility] ONLY) - Abnormal; Notable for the following components:      Result Value   Amphetamines, Ur Screen POSITIVE (*)    Cannabinoid 50 Ng, Ur [place] POSITIVE (*)    All other components within normal limits  COMPREHENSIVE METABOLIC PANEL - Abnormal; Notable for the following components:   Glucose, Bld 114 (*)    Calcium 8.7 (*)    AST 44 (*)    All other components within normal  limits  LACTIC ACID, PLASMA - Abnormal; Notable for the following components:   Lactic Acid, Venous 2.8 (*)    All other components within normal limits  GLUCOSE, CAPILLARY - Abnormal; Notable for the following components:   Glucose-Capillary 122 (*)    All other components within normal limits  CULTURE, BLOOD (ROUTINE X 2)  CULTURE, BLOOD (ROUTINE X 2)  RESPIRATORY PANEL BY RT PCR (FLU A&B, COVID)  CBC WITH DIFFERENTIAL/PLATELET  MAGNESIUM  PHOSPHORUS  LACTIC ACID, PLASMA  HEMOGLOBIN A1C  POC URINE PREG, ED  POCT PREGNANCY, URINE   ____________________________________________  EKG  None ____________________________________________  RADIOLOGY  ED MD interpretation: Ultrasound discussed with Dr. Marguerita Merles consistent with 4 cm abscess with overlying changes of cellulitis  Official radiology report(s): US BREAST LTD UNI LEFT INC AXILLA  Result Date: 08/13/2019 CLINICAL DATA:  Left breast abscess for sinus EXAM: LIMITED ULTRASOUND OF THE LEFT BREAST COMPARISON:  Previous exam(s). FINDINGS: Targeted left breast ultrasound was performed of the retroareolar area from 12-2 o'clock. There is redemonstration of a focal 4.0 x 1.1 x 2.9 cm heterogeneous fluid collection with surrounding hypervascularity. Overlying  skin appears intact with mild skin thickening and hyperemia which could reflect cellulitis. IMPRESSION: Findings most compatible with a 4 x 1.1 x 2.9 cm abscess in the upper-outer quadrant of the left breast with overlying skin changes suggestive of cellulitis. RECOMMENDATION: 1. If a breast abscess, or possible abscess, is demonstrated by ultrasound in the absence of systemic illness, non-intact overlying skin, or other clinical features requiring hospital admission, it is recommended that patient be started on an appropriate course of antibiotics and referred to the [facility] the following morning for possible abscess drainage. 2. If patient does not have a primary care physician and is to be referred to the [facility], patient is to be first referred the following morning to the [place] who has agreed to aid in the outpatient management ([address]) (phone# [phone]). Recommended antibiotics for outpatient treatment of breast abscess: No MRSA risk 1. Keflex 500 mg po qid 2. Clindamycin 300 mg po tid MRSA risk 1. Bactrim 2 tabs po bid 2. Doxycycline 100 mg po bid Subareolar abscess, recurrent abscess, nipple retraction 1. Augmentin 875 mg po bid 2. Clindamycin 300 mg po tid These results and recommendations were called by telephone at the time of interpretation on 08/13/2019 at 1:03 am to provider Mikel Hardgrove , who verbally acknowledged these results. BI-RADS CATEGORY  3: Probably benign. Electronically Signed   By: Lovena Le M.D.   On: 08/13/2019 01:03    ____________________________________________   PROCEDURES  Procedure(s) performed (including Critical Care):  Procedures   ____________________________________________   INITIAL IMPRESSION / ASSESSMENT AND PLAN / ED COURSE  As part of my medical decision making, I reviewed the following data within the [facility] notes reviewed and  incorporated, Labs reviewed, Old chart reviewed, Radiograph reviewed and Notes from prior ED visits     Haley Olsen was evaluated in Emergency Department on 08/13/2019 for the symptoms described in the history of present illness. She was evaluated in the context of the global COVID-19 pandemic, which necessitated consideration that the patient might be at risk for infection with the SARS-CoV-2 virus that causes COVID-19. Institutional protocols and algorithms that pertain to the evaluation of patients at risk for COVID-19 are in a state of rapid change based on information released by regulatory bodies including  the [employer] and federal and state organizations. These policies and algorithms were followed during the patient's care in the ED.    29 year old female who returns for admission and drainage of left breast retroareolar abscess.  Differential diagnosis includes but is not limited to worsening abscess, sepsis, etc.  Will obtain lab work including blood cultures and lactic acid.  Repeat ultrasound. Initiate IV antibiotic.  Will discuss with surgery on-call.   Clinical Course as of Aug 13 518  Sat Aug 13, 2019  0002 Spoke with surgeon on-call Dr. Celine Ahr.  Given patient's multiple medical co-morbidities and noncompliance with medications, requesting hospitalist admit the patient and she will consult in the morning.   [JS]  0130 Delay due to patient with difficult venous access.   [JS]    Clinical Course User Index [JS] Paulette Blanch, MD     ____________________________________________   FINAL CLINICAL IMPRESSION(S) / ED DIAGNOSES  Final diagnoses:  Abscess  Marijuana use  Breast abscess  Cellulitis of chest wall     ED Discharge Orders    None       Note:  This document was prepared using Dragon voice recognition software and may include unintentional dictation errors.   Paulette Blanch, MD 08/13/19 [phone]

## 2019-08-12 NOTE — ED Notes (Signed)
Pt to ED for swelling to left breast. Redness noted to left breast. Pt denies discharge. Reports pain to breast that radiates to shoulder/arm/chest.  Reports she was given antibiotics the last time she was here but was unable to fill the prescription.

## 2019-08-12 NOTE — ED Triage Notes (Signed)
Pt to the er for swelling to the left breast. Pt was seen here on the 14th for the same. Pt was advised to stay and have it drained either bedside or in the OR. Pt states she was unable to stay and left without having anything done. Pt states she was unable to follow up due to no insurance. Pt back to have abcess drained.

## 2019-08-13 ENCOUNTER — Emergency Department: Payer: Self-pay

## 2019-08-13 DIAGNOSIS — Z8673 Personal history of transient ischemic attack (TIA), and cerebral infarction without residual deficits: Secondary | ICD-10-CM

## 2019-08-13 DIAGNOSIS — N611 Abscess of the breast and nipple: Principal | ICD-10-CM

## 2019-08-13 DIAGNOSIS — F1010 Alcohol abuse, uncomplicated: Secondary | ICD-10-CM | POA: Diagnosis present

## 2019-08-13 LAB — COMPREHENSIVE METABOLIC PANEL
ALT: 35 U/L (ref 0–44)
AST: 44 U/L — ABNORMAL HIGH (ref 15–41)
Albumin: 3.8 g/dL (ref 3.5–5.0)
Alkaline Phosphatase: 84 U/L (ref 38–126)
Anion gap: 9 (ref 5–15)
BUN: 10 mg/dL (ref 6–20)
CO2: 27 mmol/L (ref 22–32)
Calcium: 8.7 mg/dL — ABNORMAL LOW (ref 8.9–10.3)
Chloride: 104 mmol/L (ref 98–111)
Creatinine, Ser: 0.67 mg/dL (ref 0.44–1.00)
GFR calc Af Amer: >60 mL/min (ref >60)
GFR calc non Af Amer: >60 mL/min (ref >60)
Glucose, Bld: 114 mg/dL — ABNORMAL HIGH (ref 70–99)
Potassium: 3.6 mmol/L (ref 3.5–5.1)
Sodium: 140 mmol/L (ref 135–145)
Total Bilirubin: 0.6 mg/dL (ref 0.3–1.2)
Total Protein: 7 g/dL (ref 6.5–8.1)

## 2019-08-13 LAB — CBC WITH DIFFERENTIAL/PLATELET
Abs Immature Granulocytes: 0.03 10*3/uL (ref 0.00–0.07)
Basophils Absolute: 0 10*3/uL (ref 0.0–0.1)
Basophils Relative: 0 %
Eosinophils Absolute: 0.1 10*3/uL (ref 0.0–0.5)
Eosinophils Relative: 1 %
HCT: 41.3 % (ref 36.0–46.0)
Hemoglobin: 13.3 g/dL (ref 12.0–15.0)
Immature Granulocytes: 0 %
Lymphocytes Relative: 31 %
Lymphs Abs: 2.8 10*3/uL (ref 0.7–4.0)
MCH: 31.2 pg (ref 26.0–34.0)
MCHC: 32.2 g/dL (ref 30.0–36.0)
MCV: 96.9 fL (ref 80.0–100.0)
Monocytes Absolute: 0.7 10*3/uL (ref 0.1–1.0)
Monocytes Relative: 8 %
Neutro Abs: 5.2 10*3/uL (ref 1.7–7.7)
Neutrophils Relative %: 60 %
Platelets: 321 10*3/uL (ref 150–400)
RBC: 4.26 MIL/uL (ref 3.87–5.11)
RDW: 13.8 % (ref 11.5–15.5)
WBC: 8.9 10*3/uL (ref 4.0–10.5)
nRBC: 0 % (ref 0.0–0.2)

## 2019-08-13 LAB — URINE DRUG SCREEN, QUALITATIVE (ARMC ONLY)
Amphetamines, Ur Screen: POSITIVE — AB
Barbiturates, Ur Screen: NOT DETECTED
Benzodiazepine, Ur Scrn: NOT DETECTED
Cannabinoid 50 Ng, Ur ~~LOC~~: POSITIVE — AB
Cocaine Metabolite,Ur ~~LOC~~: NOT DETECTED
MDMA (Ecstasy)Ur Screen: NOT DETECTED
Methadone Scn, Ur: NOT DETECTED
Opiate, Ur Screen: NOT DETECTED
Phencyclidine (PCP) Ur S: NOT DETECTED
Tricyclic, Ur Screen: NOT DETECTED

## 2019-08-13 LAB — HEMOGLOBIN A1C
Hgb A1c MFr Bld: 5.5 % (ref 4.8–5.6)
Mean Plasma Glucose: 111.15 mg/dL

## 2019-08-13 LAB — LACTIC ACID, PLASMA
Lactic Acid, Venous: 2.8 mmol/L (ref 0.5–1.9)
Lactic Acid, Venous: 0.8 mmol/L (ref 0.5–1.9)

## 2019-08-13 LAB — RESPIRATORY PANEL BY RT PCR (FLU A&B, COVID)
Influenza A by PCR: NEGATIVE
Influenza B by PCR: NEGATIVE
SARS Coronavirus 2 by RT PCR: NEGATIVE

## 2019-08-13 LAB — MAGNESIUM: Magnesium: 2 mg/dL (ref 1.7–2.4)

## 2019-08-13 LAB — GLUCOSE, CAPILLARY
Glucose-Capillary: 122 mg/dL — ABNORMAL HIGH (ref 70–99)
Glucose-Capillary: 98 mg/dL (ref 70–99)
Glucose-Capillary: 91 mg/dL (ref 70–99)
Glucose-Capillary: 134 mg/dL — ABNORMAL HIGH (ref 70–99)

## 2019-08-13 LAB — PHOSPHORUS: Phosphorus: 3.4 mg/dL (ref 2.5–4.6)

## 2019-08-13 LAB — POCT PREGNANCY, URINE: Preg Test, Ur: NEGATIVE

## 2019-08-13 MED ORDER — ADULT MULTIVITAMIN W/MINERALS CH: 1.0000 | ORAL_TABLET | Freq: Every day | ORAL | Status: DC

## 2019-08-13 MED ORDER — LORAZEPAM 2 MG/ML IJ SOLN: 0.0000 mg | Freq: Two times a day (BID) | INTRAMUSCULAR | Status: DC

## 2019-08-13 MED ORDER — LORAZEPAM 2 MG/ML IJ SOLN
0.0000 mg | Freq: Four times a day (QID) | INTRAMUSCULAR | Status: AC
  Administered 2019-08-13: 17:00:00 1 mg via INTRAVENOUS
  Filled 2019-08-13: qty 1

## 2019-08-13 MED ORDER — OXYCODONE HCL 5 MG PO TABS
5.0000 mg | ORAL_TABLET | ORAL | Status: DC | PRN
  Administered 2019-08-13: 5 mg via ORAL
  Filled 2019-08-13: qty 1

## 2019-08-13 MED ORDER — SODIUM CHLORIDE 0.9 % IV SOLN: INTRAVENOUS | Status: DC

## 2019-08-13 MED ORDER — ONDANSETRON HCL 4 MG PO TABS: 4.0000 mg | ORAL_TABLET | Freq: Four times a day (QID) | ORAL | Status: DC | PRN

## 2019-08-13 MED ORDER — HYDROXYZINE HCL 25 MG PO TABS
25.0000 mg | ORAL_TABLET | Freq: Four times a day (QID) | ORAL | Status: DC | PRN
  Administered 2019-08-14 – 2019-08-15 (×3): 25 mg via ORAL
  Filled 2019-08-13 (×5): qty 1

## 2019-08-13 MED ORDER — CLINDAMYCIN PHOSPHATE 600 MG/50ML IV SOLN
600.0000 mg | Freq: Three times a day (TID) | INTRAVENOUS | Status: DC
  Administered 2019-08-13 – 2019-08-15 (×7): 600 mg via INTRAVENOUS
  Filled 2019-08-13 (×12): qty 50

## 2019-08-13 MED ORDER — OXYCODONE HCL 5 MG PO TABS
5.0000 mg | ORAL_TABLET | Freq: Four times a day (QID) | ORAL | Status: DC | PRN
  Administered 2019-08-13 – 2019-08-15 (×6): 5 mg via ORAL
  Filled 2019-08-13 (×6): qty 1

## 2019-08-13 MED ORDER — NICOTINE 21 MG/24HR TD PT24
21.0000 mg | MEDICATED_PATCH | TRANSDERMAL | Status: AC
  Administered 2019-08-13: 21 mg via TRANSDERMAL
  Filled 2019-08-13: qty 1

## 2019-08-13 MED ORDER — NICOTINE 21 MG/24HR TD PT24
21.0000 mg | MEDICATED_PATCH | Freq: Once | TRANSDERMAL | Status: AC
  Administered 2019-08-13: 21 mg via TRANSDERMAL
  Filled 2019-08-13: qty 1

## 2019-08-13 MED ORDER — INSULIN ASPART 100 UNIT/ML ~~LOC~~ SOLN
0.0000 [IU] | SUBCUTANEOUS | Status: DC
  Administered 2019-08-13: 2 [IU] via SUBCUTANEOUS
  Filled 2019-08-13: qty 1

## 2019-08-13 MED ORDER — THIAMINE HCL 100 MG PO TABS
100.0000 mg | ORAL_TABLET | Freq: Every day | ORAL | Status: DC
  Administered 2019-08-14 – 2019-08-15 (×2): 100 mg via ORAL
  Filled 2019-08-13 (×3): qty 1

## 2019-08-13 MED ORDER — THIAMINE HCL 100 MG/ML IJ SOLN
100.0000 mg | Freq: Every day | INTRAMUSCULAR | Status: DC
  Administered 2019-08-13: 100 mg via INTRAVENOUS
  Filled 2019-08-13: qty 2

## 2019-08-13 MED ORDER — FOLIC ACID 1 MG PO TABS
1.0000 mg | ORAL_TABLET | Freq: Every day | ORAL | Status: DC
  Administered 2019-08-14 – 2019-08-15 (×2): 1 mg via ORAL
  Filled 2019-08-13 (×2): qty 1

## 2019-08-13 MED ORDER — NICOTINE 21 MG/24HR TD PT24
21.0000 mg | MEDICATED_PATCH | Freq: Every day | TRANSDERMAL | Status: DC
  Administered 2019-08-14 – 2019-08-15 (×2): 21 mg via TRANSDERMAL
  Filled 2019-08-13 (×2): qty 1

## 2019-08-13 MED ORDER — ACETAMINOPHEN 650 MG RE SUPP: 650.0000 mg | Freq: Four times a day (QID) | RECTAL | Status: DC | PRN

## 2019-08-13 MED ORDER — LORAZEPAM 1 MG PO TABS
1.0000 mg | ORAL_TABLET | ORAL | Status: DC | PRN
  Administered 2019-08-13: 1 mg via ORAL
  Filled 2019-08-13: qty 1

## 2019-08-13 MED ORDER — TRAZODONE HCL 50 MG PO TABS: 100.0000 mg | ORAL_TABLET | Freq: Every evening | ORAL | Status: DC | PRN

## 2019-08-13 MED ORDER — ACETAMINOPHEN 325 MG PO TABS: 650.0000 mg | ORAL_TABLET | Freq: Four times a day (QID) | ORAL | Status: DC | PRN

## 2019-08-13 MED ORDER — ONDANSETRON HCL 4 MG/2ML IJ SOLN: 4.0000 mg | Freq: Four times a day (QID) | INTRAMUSCULAR | Status: DC | PRN

## 2019-08-13 MED ORDER — LORAZEPAM 2 MG/ML IJ SOLN
1.0000 mg | INTRAMUSCULAR | Status: DC | PRN
  Administered 2019-08-13: 2 mg via INTRAVENOUS
  Filled 2019-08-13: qty 1

## 2019-08-13 MED ORDER — ENOXAPARIN SODIUM 40 MG/0.4ML ~~LOC~~ SOLN
40.0000 mg | SUBCUTANEOUS | Status: DC
  Administered 2019-08-13: 40 mg via SUBCUTANEOUS
  Filled 2019-08-13 (×2): qty 0.4

## 2019-08-13 NOTE — Progress Notes (Signed)
PROGRESS NOTE    Haley Olsen  [id number] DOB: [birth year] DOA: 08/12/2019 PCP: Lesleigh Noe, MD    Assessment & Plan:   Principal Problem:   Abscess of left breast Active Problems:   Seizures ([place])   History of heroin abuse ([place])   Type 2 diabetes mellitus without complication, without long-term current use of insulin ([facility])   Excessive drinking of alcohol   History of CVA (cerebrovascular accident)   Left breast abscess    Haley Olsen is a 29 y.o. Caucasian female with medical history significant for type 2 diabetes, IV drug use in remission, history of CVA without residual deficit and seizures, heavy alcohol use, recently seen in the ER on 3/14 for left breast abscess seen by surgery with recommendation for aspiration by IR but who did not stay for treatment, who returns to the ER today with worsening left breast pain and an enlarging area of redness. She was prescribed antibiotics during her recent visit but did not fill the prescription.      Abscess and cellulitis of left breast --Repeat ultrasound of the breast shows a 4 x 1.1 x 2.9 cm abscess in the upper outer quadrant of the left breast with overlying skin changes suggestive of cellulitis. PLAN: -continue IV clindamycin -Pain control -Surgical consult for I/D (IR said abscess is too superficial for a drain)    Seizures ([place]) -Noncompliant with Keppra. Says it makes her feel weak    History of heroin abuse ([place]) -Patient said that she quit using heroin 8 months ago but use it once 1 month ago and did not like the way she felt    Type 2 diabetes mellitus without complication, without long-term current use of insulin ([facility]) -Hemoglobin A1c -Sliding scale coverage regular insulin    Excessive drinking of alcohol -She has been drinking a little less than half pint of liquor daily to treat her anxiety -CIWA withdrawal protocol    History of CVA (cerebrovascular accident) -No residual  deficits  Anxiety and insomnia  --Atarax PRN --Trazodone nightly PRN   Tobacco abuse --Nicotine patch   DVT prophylaxis: Lovenox SQ Code Status: Full code  Family Communication:  Disposition Plan: from home, back to home, in 1-2 days, after I/D and improvement in cellulitis   Subjective and Interval History:  Pt complained of pain in her left breast, and anxiety which is chronic.  No fever, dyspnea, abdominal pain, N/V/D, dysuria.   Objective: Vitals:   08/12/19 1734 08/12/19 1739 08/13/19 0442 08/13/19 1354  BP:  131/87 102/79 104/74  Pulse:  (!) 114 94 86  Resp:  18 16 17   Temp:  (!) 97.5 F (36.4 C)  98 F (36.7 C)  TempSrc:  Oral  Oral  SpO2:  97% 97% 98%  Weight: 59.9 kg     Height: 5\' 4"  (1.626 m)      No intake or output data in the 24 hours ending 08/13/19 1755 Filed Weights   08/12/19 1734  Weight: 59.9 kg    Examination:   Constitutional: NAD, AAOx3 HEENT: conjunctivae and lids normal, EOMI CV: RRR no M,R,G. Distal pulses +2.  No cyanosis.   RESP: CTA B/L, normal respiratory effort  GI: +BS, NTND Extremities: No effusions, edema, or tenderness in BLE SKIN: warm, dry.  Erythema around the nipple area of the left breast Neuro: II - XII grossly intact.  Sensation intact Psych: Depressed mood and affect.     Data Reviewed: I have personally reviewed following labs  and imaging studies  CBC: Recent Labs  Lab 08/07/19 1431 08/13/19 0131  WBC 13.0* 8.9  NEUTROABS 9.2* 5.2  HGB 13.7 13.3  HCT 42.1 41.3  MCV 95.9 96.9  PLT 300 [id number]   Basic Metabolic Panel: Recent Labs  Lab 08/07/19 1431 08/13/19 0307  NA 137 140  K 4.0 3.6  CL 104 104  CO2 23 27  GLUCOSE 90 114*  BUN 14 10  CREATININE 0.72 0.67  CALCIUM 9.0 8.7*  MG  --  2.0  PHOS  --  3.4   GFR: Estimated Creatinine Clearance: 90.4 mL/min (by C-G formula based on SCr of 0.67 mg/dL). Liver Function Tests: Recent Labs  Lab 08/07/19 1431 08/13/19 0307  AST 27 44*  ALT 24 35    ALKPHOS 88 84  BILITOT 0.6 0.6  PROT 7.5 7.0  ALBUMIN 4.0 3.8   No results for input(s): LIPASE, AMYLASE in the last 168 hours. No results for input(s): AMMONIA in the last 168 hours. Coagulation Profile: No results for input(s): INR, PROTIME in the last 168 hours. Cardiac Enzymes: No results for input(s): CKTOTAL, CKMB, CKMBINDEX, TROPONINI in the last 168 hours. BNP (last 3 results) No results for input(s): PROBNP in the last 8760 hours. HbA1C: Recent Labs    08/13/19 0307  HGBA1C 5.5   CBG: Recent Labs  Lab 08/13/19 0505 08/13/19 0941 08/13/19 1350  GLUCAP 122* 98 91   Lipid Profile: No results for input(s): CHOL, HDL, LDLCALC, TRIG, CHOLHDL, LDLDIRECT in the last 72 hours. Thyroid Function Tests: No results for input(s): TSH, T4TOTAL, FREET4, T3FREE, THYROIDAB in the last 72 hours. Anemia Panel: No results for input(s): VITAMINB12, FOLATE, FERRITIN, TIBC, IRON, RETICCTPCT in the last 72 hours. Sepsis Labs: Recent Labs  Lab 08/13/19 0307 08/13/19 1440  LATICACIDVEN 2.8* 0.8    Recent Results (from the past 240 hour(s))  Respiratory Panel by RT PCR (Flu A&B, Covid) -     Status: None   Collection Time: 08/13/19  1:08 AM  Result Value Ref Range Status   SARS Coronavirus 2 by RT PCR NEGATIVE NEGATIVE Final    Comment: (NOTE) SARS-CoV-2 target nucleic acids are NOT DETECTED. The SARS-CoV-2 RNA is generally detectable in upper respiratoy specimens during the acute phase of infection. The lowest concentration of SARS-CoV-2 viral copies this assay can detect is 131 copies/mL. A negative result does not preclude SARS-Cov-2 infection and should not be used as the sole basis for treatment or other patient management decisions. A negative result may occur with  improper specimen collection/handling, submission of specimen other than nasopharyngeal swab, presence of viral mutation(s) within the areas targeted by this assay, and inadequate number of viral copies (<131  copies/mL). A negative result must be combined with clinical observations, patient history, and epidemiological information. The expected result is Negative. Fact Sheet for Patients:  PinkCheek.be Fact Sheet for Healthcare Providers:  GravelBags.it This test is not yet ap proved or cleared by the [place] FDA and  has been authorized for detection and/or diagnosis of SARS-CoV-2 by FDA under an Emergency Use Authorization (EUA). This EUA will remain  in effect (meaning this test can be used) for the duration of the COVID-19 declaration under Section 564(b)(1) of the Act, 21 U.S.C. section 360bbb-3(b)(1), unless the authorization is terminated or revoked sooner.    Influenza A by PCR NEGATIVE NEGATIVE Final   Influenza B by PCR NEGATIVE NEGATIVE Final    Comment: (NOTE) The Xpert Xpress SARS-CoV-2/FLU/RSV assay is intended as an aid  in  the diagnosis of influenza from Nasopharyngeal swab specimens and  should not be used as a sole basis for treatment. Nasal washings and  aspirates are unacceptable for Xpert Xpress SARS-CoV-2/FLU/RSV  testing. Fact Sheet for Patients: PinkCheek.be Fact Sheet for Healthcare Providers: GravelBags.it This test is not yet approved or cleared by the [place] FDA and  has been authorized for detection and/or diagnosis of SARS-CoV-2 by  FDA under an Emergency Use Authorization (EUA). This EUA will remain  in effect (meaning this test can be used) for the duration of the  Covid-19 declaration under Section 564(b)(1) of the Act, 21  U.S.C. section 360bbb-3(b)(1), unless the authorization is  terminated or revoked. Performed at [facility], [address] [place], [place] [address]   Culture, blood (routine x 2)     Status: None (Preliminary result)   Collection Time: 08/13/19  1:51 AM   Specimen: BLOOD  Result Value Ref  Range Status   Specimen Description BLOOD RIGHT ANTECUBITAL  Final   Special Requests   Final    BOTTLES DRAWN AEROBIC AND ANAEROBIC Blood Culture adequate volume   Culture   Final    NO GROWTH < 12 HOURS Performed at [facility], [address] [place], [place] [address]    Report Status PENDING  Incomplete      Radiology Studies: US BREAST LTD UNI LEFT INC AXILLA  Result Date: 08/13/2019 CLINICAL DATA:  Left breast abscess for sinus EXAM: LIMITED ULTRASOUND OF THE LEFT BREAST COMPARISON:  Previous exam(s). FINDINGS: Targeted left breast ultrasound was performed of the retroareolar area from 12-2 o'clock. There is redemonstration of a focal 4.0 x 1.1 x 2.9 cm heterogeneous fluid collection with surrounding hypervascularity. Overlying skin appears intact with mild skin thickening and hyperemia which could reflect cellulitis. IMPRESSION: Findings most compatible with a 4 x 1.1 x 2.9 cm abscess in the upper-outer quadrant of the left breast with overlying skin changes suggestive of cellulitis. RECOMMENDATION: 1. If a breast abscess, or possible abscess, is demonstrated by ultrasound in the absence of systemic illness, non-intact overlying skin, or other clinical features requiring hospital admission, it is recommended that patient be started on an appropriate course of antibiotics and referred to the [facility] the following morning for possible abscess drainage. 2. If patient does not have a primary care physician and is to be referred to the [facility], patient is to be first referred the following morning to the [place] who has agreed to aid in the outpatient management ([address] (phone# [phone]). Recommended antibiotics for outpatient treatment of breast abscess: No MRSA risk 1. Keflex 500 mg po qid 2. Clindamycin 300 mg po tid MRSA risk 1. Bactrim 2 tabs po bid 2. Doxycycline 100 mg po bid  Subareolar abscess, recurrent abscess, nipple retraction 1. Augmentin 875 mg po bid 2. Clindamycin 300 mg po tid These results and recommendations were called by telephone at the time of interpretation on 08/13/2019 at 1:03 am to provider JADE SUNG , who verbally acknowledged these results. BI-RADS CATEGORY  3: Probably benign. Electronically Signed   By: Lovena Le M.D.   On: 08/13/2019 01:03     Scheduled Meds: . enoxaparin (LOVENOX) injection  40 mg Subcutaneous Q24H  . folic acid  1 mg Oral Daily  . insulin aspart  0-15 Units Subcutaneous Q4H  . LORazepam  0-4 mg Intravenous Q6H   Followed by  . [  START ON 08/15/2019] LORazepam  0-4 mg Intravenous Q12H  . multivitamin with minerals  1 tablet Oral Daily  . nicotine  21 mg Transdermal Once  . thiamine  100 mg Oral Daily   Or  . thiamine  100 mg Intravenous Daily   Continuous Infusions: . clindamycin (CLEOCIN) IV Stopped (08/13/19 1103)     LOS: 0 days     Enzo Bi, MD [facility] If 7PM-7AM, please contact night-coverage 08/13/2019, 5:55 PM

## 2019-08-13 NOTE — ED Notes (Signed)
Unable to obtain 2nd blood culture. This RN attempted x2. IV team attempted x2.

## 2019-08-13 NOTE — H&P (Signed)
History and Physical    Haley Olsen [id number] DOB: [birth year] DOA: 08/12/2019  PCP: Lesleigh Noe, MD   Patient coming from: home  I have personally briefly reviewed patient's old medical records in [place]  Chief Complaint: left breast pain  HPI: Haley Olsen is a 29 y.o. 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 She was prescribed antibiotics during her recent visit but did not fill the prescription. She denies fever or chills, nausea or vomiting.  ED Course: In the ER she was tachycardic at 114 and afebrile at 97.5 with otherwise normal vitals. WBC was normal at 8.9. Other blood work unremarkable. Urine drug screen positive for amphetamines and cannabis. Repeat ultrasound of the breast shows a 4 x 1.1 x 2.9 cm abscess in the upper outer quadrant of the left breast with overlying skin changes suggestive of cellulitis. ER provider contacted surgeon Dr. Celine Ahr who will see patient in consultation. Review of Systems: As per HPI otherwise 10 point review of systems negative.    Past Medical History:  Diagnosis Date  . Anxiety   . Arthritis   . Asthma   . Chlamydia   . Depression   . Diabetes mellitus without complication ([place])   . Drug addiction ([place])   . GERD (gastroesophageal reflux disease)   . Hepatitis C   . History of syphilis   . Migraine   . Seizures ([place])   . Stroke [facility])     Past Surgical History:  Procedure Laterality Date  . NOSE SURGERY     reconstructive     reports that she has been smoking cigarettes. She has a 3.00 pack-year smoking history. She has never used smokeless  tobacco. She reports current alcohol use. She reports previous drug use. Drugs: IV, Marijuana, and Heroin.  Allergies  Allergen Reactions  . Wasp Venom Protein Anaphylaxis  . Hydrocodone Hives and Itching    Family History  Problem Relation Age of Onset  . Hyperlipidemia Maternal Grandmother   . Heart disease Maternal Grandmother   . Drug abuse Maternal Grandmother   . Diabetes Maternal Grandmother   . Depression Maternal Grandmother   . Asthma Maternal Grandmother   . Heart attack Maternal Grandmother 50  . Depression Mother   . Drug abuse Mother        in recovery  . Alcohol abuse Father   . Arthritis Father   . Depression Father   . Drug abuse Father   . Hearing loss Father   . Diabetes Maternal Grandfather   . Drug abuse Maternal Grandfather   . Heart attack Maternal Grandfather   . Heart disease Maternal Grandfather   . Heart attack Paternal Grandmother 38  . Alcohol abuse Paternal Grandmother   . Depression Paternal Grandmother      Prior to Admission medications   Medication Sig Start Date End Date Taking? Authorizing Provider  clindamycin (CLEOCIN) 300 MG capsule Take 1 capsule (300 mg total) by mouth 3 (three) times daily for 10 days. 08/07/19 08/17/19  Lannie Fields, PA-C    Physical Exam: Vitals:   08/12/19 1734 08/12/19 1739  BP:  131/87  Pulse:  (!) 114  Resp:  18  Temp:  (!) 97.5 F (36.4 C)  TempSrc:  Oral  SpO2:  97%  Weight: 59.9 kg   Height: 5\' 4"  (1.626 m)      Vitals:   08/12/19 1734 08/12/19 1739  BP:  131/87  Pulse:  (!) 114  Resp:  18  Temp:  (!) 97.5 F (36.4 C)  TempSrc:  Oral  SpO2:  97%  Weight: 59.9 kg   Height: 5\' 4"  (1.626 m)     Constitutional: Alert and awake, oriented x3, not in any acute distress. Eyes: PERLA, EOMI, irises appear normal, anicteric sclera,  ENMT: external ears and nose appear normal, normal hearing             Lips appears normal, oropharynx mucosa, tongue, posterior pharynx appear normal  Neck:  neck appears normal, no masses, normal ROM, no thyromegaly, no JVD  CVS: S1-S2 clear, no murmur rubs or gallops,  , no carotid bruits, pedal pulses palpable, No LE edema Respiratory:  clear to auscultation bilaterally, no wheezing, rales or rhonchi. Respiratory effort normal. No accessory muscle use.  Abdomen: soft nontender, nondistended, normal bowel sounds, no hepatosplenomegaly, no hernias Musculoskeletal: : no cyanosis, clubbing , no contractures or atrophy Neuro: Cranial nerves II-XII intact, sensation, reflexes normal, strength Psych: judgement and insight appear normal, stable mood and affect,  Skin: Left periareolar redness and tenderness   Labs on Admission: I have personally reviewed following labs and imaging studies  CBC: Recent Labs  Lab 08/07/19 1431 08/13/19 0131  WBC 13.0* 8.9  NEUTROABS 9.2* 5.2  HGB 13.7 13.3  HCT 42.1 41.3  MCV 95.9 96.9  PLT 300 [id number]   Basic Metabolic Panel: Recent Labs  Lab 08/07/19 1431  NA 137  K 4.0  CL 104  CO2 23  GLUCOSE 90  BUN 14  CREATININE 0.72  CALCIUM 9.0   GFR: Estimated Creatinine Clearance: 90.4 mL/min (by C-G formula based on SCr of 0.72 mg/dL). Liver Function Tests: Recent Labs  Lab 08/07/19 1431  AST 27  ALT 24  ALKPHOS 88  BILITOT 0.6  PROT 7.5  ALBUMIN 4.0   No results for input(s): LIPASE, AMYLASE in the last 168 hours. No results for input(s): AMMONIA in the last 168 hours. Coagulation Profile: No results for input(s): INR, PROTIME in the last 168 hours. Cardiac Enzymes: No results for input(s): CKTOTAL, CKMB, CKMBINDEX, TROPONINI in the last 168 hours. BNP (last 3 results) No results for input(s): PROBNP in the last 8760 hours. HbA1C: No results for input(s): HGBA1C in the last 72 hours. CBG: No results for input(s): GLUCAP in the last 168 hours. Lipid Profile: No results for input(s): CHOL, HDL, LDLCALC, TRIG, CHOLHDL, LDLDIRECT in the last 72 hours. Thyroid Function Tests: No results for  input(s): TSH, T4TOTAL, FREET4, T3FREE, THYROIDAB in the last 72 hours. Anemia Panel: No results for input(s): VITAMINB12, FOLATE, FERRITIN, TIBC, IRON, RETICCTPCT in the last 72 hours. Urine analysis:    Component Value Date/Time   COLORURINE YELLOW 12/12/2018 1529   APPEARANCEUR CLEAR 12/12/2018 1529   LABSPEC 1.021 12/12/2018 1529   PHURINE 5.0 12/12/2018 1529   GLUCOSEU NEGATIVE 12/12/2018 1529   HGBUR SMALL (A) 12/12/2018 1529   BILIRUBINUR NEGATIVE 12/12/2018 1529   KETONESUR 80 (A) 12/12/2018 1529   PROTEINUR 100 (A) 12/12/2018 1529   UROBILINOGEN 0.2 07/14/2011 1800   NITRITE NEGATIVE 12/12/2018 1529   LEUKOCYTESUR NEGATIVE 12/12/2018 1529    Radiological Exams on Admission:  US BREAST LTD UNI LEFT INC AXILLA  Result Date: 08/13/2019 CLINICAL DATA:  Left breast abscess for sinus EXAM: LIMITED ULTRASOUND OF THE LEFT BREAST COMPARISON:  Previous exam(s). FINDINGS: Targeted left breast ultrasound was performed of the retroareolar area from 12-2 o'clock. There is redemonstration of a focal 4.0 x 1.1 x 2.9 cm heterogeneous fluid collection with surrounding hypervascularity. Overlying skin appears intact with mild skin thickening and hyperemia which could reflect cellulitis. IMPRESSION: Findings most compatible with a 4 x 1.1 x 2.9 cm abscess in the upper-outer quadrant of the left breast with overlying skin changes suggestive of cellulitis. RECOMMENDATION: 1. If a breast abscess, or possible abscess, is demonstrated by ultrasound in the absence of systemic illness, non-intact overlying skin, or other clinical features requiring hospital admission, it is recommended that patient be started on an appropriate course of antibiotics and referred to the [facility] the following morning for possible abscess drainage. 2. If patient does not have a primary care physician and is to be referred to the [facility], patient is to be first referred the  following morning to the [facility] [place] who has agreed to aid in the outpatient management ([address] (phone# [phone]). Recommended antibiotics for outpatient treatment of breast abscess: No MRSA risk 1. Keflex 500 mg po qid 2. Clindamycin 300 mg po tid MRSA risk 1. Bactrim 2 tabs po bid 2. Doxycycline 100 mg po bid Subareolar abscess, recurrent abscess, nipple retraction 1. Augmentin 875 mg po bid 2. Clindamycin 300 mg po tid These results and recommendations were called by telephone at the time of interpretation on 08/13/2019 at 1:03 am to provider JADE SUNG , who verbally acknowledged these results. BI-RADS CATEGORY  3: Probably benign. Electronically Signed   By: Lovena Le M.D.   On: 08/13/2019 01:03      Assessment/Plan Principal Problem:   Abscess of left breast -IV clindamycin -Pain control -Surgical consult    Seizures (HCC) -Noncompliant with Keppra. Says it makes her feel weak -Ativan as needed seizures    History of heroin abuse ([place]) -Patient said that she quit using heroin 8 months ago but use it once 1 month ago and did not like the way she felt    Type 2 diabetes mellitus without complication, without long-term current use of insulin (HCC) -Hemoglobin A1c -Sliding scale coverage regular insulin -   Excessive drinking of alcohol -She has been drinking a little less than half pint of liquor daily to treat her anxiety -CIWA withdrawal protocol    History of CVA (cerebrovascular accident) -No residual deficits -With medication    DVT prophylaxis: Lovenox  Code Status: full code  Family Communication:  none  Disposition Plan: Back to previous home environment Consults called: Dr Celine Ahr, surgery Status:ibs    Athena Masse MD [facility]     08/13/2019, 3:38 AM

## 2019-08-13 NOTE — ED Notes (Signed)
IV team at bedside 

## 2019-08-13 NOTE — Consult Note (Signed)
Reason for Consult: Breast abscess Referring Physician: Enzo Bi, MD (hospital medicine)  Haley Olsen is an 29 y.o. female.  HPI: She came to the emergency department a week ago with a left breast abscess.  She was seen by Dr. Lysle Pearl and the plan was to admit for IR aspiration, however the patient declined to stay due to work.  She was unable to fill the prescription for clindamycin due to lack of funds and failed to follow-up with Dr. Lysle Pearl as instructed.  She came back to the emergency department last night with increased pain and size of the abscess.  She denied any fevers or chills.  No nausea or vomiting.  Of note, she has a strong history of medical noncompliance.  Past Medical History:  Diagnosis Date  . Anxiety   . Arthritis   . Asthma   . Chlamydia   . Depression   . Diabetes mellitus without complication ([place])   . Drug addiction ([place])   . GERD (gastroesophageal reflux disease)   . Hepatitis C   . History of syphilis   . Migraine   . Seizures ([place])   . Stroke [facility])     Past Surgical History:  Procedure Laterality Date  . NOSE SURGERY     reconstructive    Family History  Problem Relation Age of Onset  . Hyperlipidemia Maternal Grandmother   . Heart disease Maternal Grandmother   . Drug abuse Maternal Grandmother   . Diabetes Maternal Grandmother   . Depression Maternal Grandmother   . Asthma Maternal Grandmother   . Heart attack Maternal Grandmother 50  . Depression Mother   . Drug abuse Mother        in recovery  . Alcohol abuse Father   . Arthritis Father   . Depression Father   . Drug abuse Father   . Hearing loss Father   . Diabetes Maternal Grandfather   . Drug abuse Maternal Grandfather   . Heart attack Maternal Grandfather   . Heart disease Maternal Grandfather   . Heart attack Paternal Grandmother 39  . Alcohol abuse Paternal Grandmother   . Depression Paternal Grandmother     Social History:  reports that she has been smoking  cigarettes. She has a 3.00 pack-year smoking history. She has never used smokeless tobacco. She reports current alcohol use. She reports previous drug use. Drugs: IV, Marijuana, and Heroin.  Allergies:  Allergies  Allergen Reactions  . Wasp Venom Protein Anaphylaxis  . Hydrocodone Hives and Itching    Medications: I have reviewed the patient's current medications.  Results for orders placed or performed during the hospital encounter of 08/12/19 (from the past 48 hour(s))  Respiratory Panel by RT PCR (Flu A&B, Covid) -     Status: None   Collection Time: 08/13/19  1:08 AM  Result Value Ref Range   SARS Coronavirus 2 by RT PCR NEGATIVE NEGATIVE    Comment: (NOTE) SARS-CoV-2 target nucleic acids are NOT DETECTED. The SARS-CoV-2 RNA is generally detectable in upper respiratoy specimens during the acute phase of infection. The lowest concentration of SARS-CoV-2 viral copies this assay can detect is 131 copies/mL. A negative result does not preclude SARS-Cov-2 infection and should not be used as the sole basis for treatment or other patient management decisions. A negative result may occur with  improper specimen collection/handling, submission of specimen other than nasopharyngeal swab, presence of viral mutation(s) within the areas targeted by this assay, and inadequate number of viral copies (<131 copies/mL).  A negative result must be combined with clinical observations, patient history, and epidemiological information. The expected result is Negative. Fact Sheet for Patients:  PinkCheek.be Fact Sheet for Healthcare Providers:  GravelBags.it This test is not yet ap proved or cleared by the [place] FDA and  has been authorized for detection and/or diagnosis of SARS-CoV-2 by FDA under an Emergency Use Authorization (EUA). This EUA will remain  in effect (meaning this test can be used) for the duration of the COVID-19  declaration under Section 564(b)(1) of the Act, 21 U.S.C. section 360bbb-3(b)(1), unless the authorization is terminated or revoked sooner.    Influenza A by PCR NEGATIVE NEGATIVE   Influenza B by PCR NEGATIVE NEGATIVE    Comment: (NOTE) The Xpert Xpress SARS-CoV-2/FLU/RSV assay is intended as an aid in  the diagnosis of influenza from Nasopharyngeal swab specimens and  should not be used as a sole basis for treatment. Nasal washings and  aspirates are unacceptable for Xpert Xpress SARS-CoV-2/FLU/RSV  testing. Fact Sheet for Patients: PinkCheek.be Fact Sheet for Healthcare Providers: GravelBags.it This test is not yet approved or cleared by the [place] FDA and  has been authorized for detection and/or diagnosis of SARS-CoV-2 by  FDA under an Emergency Use Authorization (EUA). This EUA will remain  in effect (meaning this test can be used) for the duration of the  Covid-19 declaration under Section 564(b)(1) of the Act, 21  U.S.C. section 360bbb-3(b)(1), unless the authorization is  terminated or revoked. Performed at [facility], [address] [place], [place] [address]   Urine Drug Screen, Qualitative     Status: Abnormal   Collection Time: 08/13/19  1:08 AM  Result Value Ref Range   Tricyclic, Ur Screen NONE DETECTED NONE DETECTED   Amphetamines, Ur Screen POSITIVE (A) NONE DETECTED   MDMA (Ecstasy)Ur Screen NONE DETECTED NONE DETECTED   Cocaine Metabolite,Ur [place] NONE DETECTED NONE DETECTED   Opiate, Ur Screen NONE DETECTED NONE DETECTED   Phencyclidine (PCP) Ur S NONE DETECTED NONE DETECTED   Cannabinoid 50 Ng, Ur [place] POSITIVE (A) NONE DETECTED   Barbiturates, Ur Screen NONE DETECTED NONE DETECTED   Benzodiazepine, Ur Scrn NONE DETECTED NONE DETECTED   Methadone Scn, Ur NONE DETECTED NONE DETECTED    Comment: (NOTE) Tricyclics + metabolites, urine    Cutoff 1000 ng/mL Amphetamines + metabolites,  urine  Cutoff 1000 ng/mL MDMA (Ecstasy), urine              Cutoff 500 ng/mL Cocaine Metabolite, urine          Cutoff 300 ng/mL Opiate + metabolites, urine        Cutoff 300 ng/mL Phencyclidine (PCP), urine         Cutoff 25 ng/mL Cannabinoid, urine                 Cutoff 50 ng/mL Barbiturates + metabolites, urine  Cutoff 200 ng/mL Benzodiazepine, urine              Cutoff 200 ng/mL Methadone, urine                   Cutoff 300 ng/mL The urine drug screen provides only a preliminary, unconfirmed analytical test result and should not be used for non-medical purposes. Clinical consideration and professional judgment should be applied to any positive drug screen result due to possible interfering substances. A more specific alternate chemical method must be used in order to obtain a confirmed analytical result. Gas chromatography /  mass spectrometry (GC/MS) is the preferred confirmat ory method. Performed at [facility], [address] [place], [place] [address]   Pregnancy, urine POC     Status: None   Collection Time: 08/13/19  1:13 AM  Result Value Ref Range   Preg Test, Ur NEGATIVE NEGATIVE    Comment:        THE SENSITIVITY OF THIS METHODOLOGY IS >24 mIU/mL   CBC with Differential     Status: None   Collection Time: 08/13/19  1:31 AM  Result Value Ref Range   WBC 8.9 4.0 - 10.5 K/uL   RBC 4.26 3.87 - 5.11 MIL/uL   Hemoglobin 13.3 12.0 - 15.0 g/dL   HCT 41.3 36.0 - 46.0 %   MCV 96.9 80.0 - 100.0 fL   MCH 31.2 26.0 - 34.0 pg   MCHC 32.2 30.0 - 36.0 g/dL   RDW 13.8 11.5 - 15.5 %   Platelets 321 150 - 400 K/uL   nRBC 0.0 0.0 - 0.2 %   Neutrophils Relative % 60 %   Neutro Abs 5.2 1.7 - 7.7 K/uL   Lymphocytes Relative 31 %   Lymphs Abs 2.8 0.7 - 4.0 K/uL   Monocytes Relative 8 %   Monocytes Absolute 0.7 0.1 - 1.0 K/uL   Eosinophils Relative 1 %   Eosinophils Absolute 0.1 0.0 - 0.5 K/uL   Basophils Relative 0 %   Basophils Absolute 0.0 0.0 - 0.1 K/uL    Immature Granulocytes 0 %   Abs Immature Granulocytes 0.03 0.00 - 0.07 K/uL    Comment: Performed at [facility], [address] [place], [place] [address]  Culture, blood (routine x 2)     Status: None (Preliminary result)   Collection Time: 08/13/19  1:51 AM   Specimen: BLOOD  Result Value Ref Range   Specimen Description BLOOD RIGHT ANTECUBITAL    Special Requests      BOTTLES DRAWN AEROBIC AND ANAEROBIC Blood Culture adequate volume   Culture      NO GROWTH < 12 HOURS Performed at [facility], [address] [place], [place] [address]    Report Status PENDING   Comprehensive metabolic panel     Status: Abnormal   Collection Time: 08/13/19  3:07 AM  Result Value Ref Range   Sodium 140 135 - 145 mmol/L   Potassium 3.6 3.5 - 5.1 mmol/L   Chloride 104 98 - 111 mmol/L   CO2 27 22 - 32 mmol/L   Glucose, Bld 114 (H) 70 - 99 mg/dL    Comment: Glucose reference range applies only to samples taken after fasting for at least 8 hours.   BUN 10 6 - 20 mg/dL   Creatinine, Ser 0.67 0.44 - 1.00 mg/dL   Calcium 8.7 (L) 8.9 - 10.3 mg/dL   Total Protein 7.0 6.5 - 8.1 g/dL   Albumin 3.8 3.5 - 5.0 g/dL   AST 44 (H) 15 - 41 U/L   ALT 35 0 - 44 U/L   Alkaline Phosphatase 84 38 - 126 U/L   Total Bilirubin 0.6 0.3 - 1.2 mg/dL   GFR calc non Af Amer >60 >60 mL/min   GFR calc Af Amer >60 >60 mL/min   Anion gap 9 5 - 15    Comment: Performed at [facility], [address] [place], [place] [address]  Lactic acid, plasma     Status: Abnormal   Collection Time: 08/13/19  3:07 AM  Result Value Ref  Range   Lactic Acid, Venous 2.8 (HH) 0.5 - 1.9 mmol/L    Comment: CRITICAL RESULT CALLED TO, READ BACK BY AND VERIFIED WITH VANESSA ASHLEY ON 08/13/19 AT [id number] [facility] Performed at [facility], [address] [place], [place] [address]   Magnesium     Status: None   Collection Time: 08/13/19  3:07 AM  Result Value Ref Range   Magnesium 2.0 1.7 - 2.4 mg/dL     Comment: Performed at [facility], [address] [place], [place] [address]  Phosphorus     Status: None   Collection Time: 08/13/19  3:07 AM  Result Value Ref Range   Phosphorus 3.4 2.5 - 4.6 mg/dL    Comment: Performed at [facility], [address] [place], [place] [address]  Hemoglobin A1c     Status: None   Collection Time: 08/13/19  3:07 AM  Result Value Ref Range   Hgb A1c MFr Bld 5.5 4.8 - 5.6 %    Comment: (NOTE) Pre diabetes:          5.7%-6.4% Diabetes:              >6.4% Glycemic control for   <7.0% adults with diabetes    Mean Plasma Glucose 111.15 mg/dL    Comment: Performed at [facility] [address], [place], [place] [address]  Glucose, capillary     Status: Abnormal   Collection Time: 08/13/19  5:05 AM  Result Value Ref Range   Glucose-Capillary 122 (H) 70 - 99 mg/dL    Comment: Glucose reference range applies only to samples taken after fasting for at least 8 hours.  Glucose, capillary     Status: None   Collection Time: 08/13/19  9:41 AM  Result Value Ref Range   Glucose-Capillary 98 70 - 99 mg/dL    Comment: Glucose reference range applies only to samples taken after fasting for at least 8 hours.  Glucose, capillary     Status: None   Collection Time: 08/13/19  1:50 PM  Result Value Ref Range   Glucose-Capillary 91 70 - 99 mg/dL    Comment: Glucose reference range applies only to samples taken after fasting for at least 8 hours.  Lactic acid, plasma     Status: None   Collection Time: 08/13/19  2:40 PM  Result Value Ref Range   Lactic Acid, Venous 0.8 0.5 - 1.9 mmol/L    Comment: Performed at [facility], [address] [place], [place] [address]    US BREAST LTD UNI LEFT INC AXILLA  Result Date: 08/13/2019 CLINICAL DATA:  Left breast abscess for sinus EXAM: LIMITED ULTRASOUND OF THE LEFT BREAST COMPARISON:  Previous exam(s). FINDINGS: Targeted left breast ultrasound was performed of the retroareolar area  from 12-2 o'clock. There is redemonstration of a focal 4.0 x 1.1 x 2.9 cm heterogeneous fluid collection with surrounding hypervascularity. Overlying skin appears intact with mild skin thickening and hyperemia which could reflect cellulitis. IMPRESSION: Findings most compatible with a 4 x 1.1 x 2.9 cm abscess in the upper-outer quadrant of the left breast with overlying skin changes suggestive of cellulitis. RECOMMENDATION: 1. If a breast abscess, or possible abscess, is demonstrated by ultrasound in the absence of systemic illness, non-intact overlying skin, or other clinical features requiring hospital admission, it is recommended that patient be started on an appropriate course of antibiotics and referred to the [facility] the following morning for possible abscess drainage. 2.  If patient does not have a primary care physician and is to be referred to the [place], patient is to be first referred the following morning to the [place] who has agreed to aid in the outpatient management ([address] (phone# [phone]). Recommended antibiotics for outpatient treatment of breast abscess: No MRSA risk 1. Keflex 500 mg po qid 2. Clindamycin 300 mg po tid MRSA risk 1. Bactrim 2 tabs po bid 2. Doxycycline 100 mg po bid Subareolar abscess, recurrent abscess, nipple retraction 1. Augmentin 875 mg po bid 2. Clindamycin 300 mg po tid These results and recommendations were called by telephone at the time of interpretation on 08/13/2019 at 1:03 am to provider JADE SUNG , who verbally acknowledged these results. BI-RADS CATEGORY  3: Probably benign. Electronically Signed   By: Lovena Le M.D.   On: 08/13/2019 01:03    Review of Systems  All other systems reviewed and are negative.  Blood pressure 104/74, pulse 86, temperature 98 F (36.7 C), temperature source Oral, resp. rate 17, height 5\' 4"  (1.626 m), weight 59.9 kg, SpO2 98  %. Physical Exam  Constitutional: She is oriented to person, place, and time. She appears well-developed and well-nourished. No distress.  HENT:  Head: Normocephalic and atraumatic.  Mouth/Throat: No oropharyngeal exudate.  Eyes: Right eye exhibits no discharge. Left eye exhibits no discharge. No scleral icterus.  Cardiovascular: Normal rate and regular rhythm.  Respiratory: Effort normal. No respiratory distress. Left breast exhibits skin change.    There is induration and skin change between 10 and 2:00 just above the areola of the left breast.  The skin is peeling in the area is somewhat tender and indurated.  GI: Soft.  Genitourinary:    Genitourinary Comments: Deferred   Musculoskeletal:        General: No edema. Normal range of motion.  Neurological: She is alert and oriented to person, place, and time.  Skin: Skin is warm and dry.  Psychiatric:  She is fairly tangential as she relates the history    Assessment/Plan: This is a 29 year old woman who presented to the emergency department about a week ago with a left breast abscess.  At that time, the plan was for an aspiration by interventional radiology and antibiotics.  She left before she could be treated, failed to fill her antibiotic prescription, and did not follow-up with general surgery either.  She returned with worsening symptoms last night.  On review of the imaging, this is fairly superficial and could easily be drained at the bedside.  The patient expressed a preference for it to be simply aspirated so that she would not have to deal with packing the wound or have an open wound when she is able to go back to work (she works as a Games developer).  I discussed this with interventional radiology who felt that aspiration would be a suboptimal treatment and that incision and drainage would be preferred.  I will discuss this further with the patient and if she is amenable, I can likely perform this is a bedside procedure.  Fredirick Maudlin 08/13/2019, 7:06 PM

## 2019-08-14 DIAGNOSIS — E872 Acidosis, unspecified: Secondary | ICD-10-CM | POA: Diagnosis present

## 2019-08-14 DIAGNOSIS — F419 Anxiety disorder, unspecified: Secondary | ICD-10-CM | POA: Diagnosis present

## 2019-08-14 LAB — BASIC METABOLIC PANEL
Anion gap: 11 (ref 5–15)
BUN: 19 mg/dL (ref 6–20)
CO2: 24 mmol/L (ref 22–32)
Calcium: 9.1 mg/dL (ref 8.9–10.3)
Chloride: 105 mmol/L (ref 98–111)
Creatinine, Ser: 0.58 mg/dL (ref 0.44–1.00)
GFR calc Af Amer: >60 mL/min (ref >60)
GFR calc non Af Amer: >60 mL/min (ref >60)
Glucose, Bld: 102 mg/dL — ABNORMAL HIGH (ref 70–99)
Potassium: 4.3 mmol/L (ref 3.5–5.1)
Sodium: 140 mmol/L (ref 135–145)

## 2019-08-14 LAB — CBC
HCT: 42.2 % (ref 36.0–46.0)
Hemoglobin: 13.6 g/dL (ref 12.0–15.0)
MCH: 31.1 pg (ref 26.0–34.0)
MCHC: 32.2 g/dL (ref 30.0–36.0)
MCV: 96.6 fL (ref 80.0–100.0)
Platelets: 288 10*3/uL (ref 150–400)
RBC: 4.37 MIL/uL (ref 3.87–5.11)
RDW: 13.6 % (ref 11.5–15.5)
WBC: 6.7 10*3/uL (ref 4.0–10.5)
nRBC: 0 % (ref 0.0–0.2)

## 2019-08-14 LAB — MAGNESIUM: Magnesium: 2.2 mg/dL (ref 1.7–2.4)

## 2019-08-14 LAB — GLUCOSE, CAPILLARY
Glucose-Capillary: 99 mg/dL (ref 70–99)
Glucose-Capillary: 83 mg/dL (ref 70–99)
Glucose-Capillary: 148 mg/dL — ABNORMAL HIGH (ref 70–99)
Glucose-Capillary: 123 mg/dL — ABNORMAL HIGH (ref 70–99)
Glucose-Capillary: 127 mg/dL — ABNORMAL HIGH (ref 70–99)

## 2019-08-14 LAB — MRSA PCR SCREENING: MRSA by PCR: POSITIVE — AB

## 2019-08-14 MED ORDER — LIDOCAINE-EPINEPHRINE 1 %-1:100000 IJ SOLN
20.0000 mL | Freq: Once | INTRAMUSCULAR | Status: AC
  Administered 2019-08-14: 16:00:00 20 mL
  Filled 2019-08-14: qty 20

## 2019-08-14 MED ORDER — MUPIROCIN 2 % EX OINT
1.0000 "application " | TOPICAL_OINTMENT | Freq: Two times a day (BID) | CUTANEOUS | Status: DC
  Administered 2019-08-15: 1 via NASAL
  Filled 2019-08-14: qty 22

## 2019-08-14 MED ORDER — CHLORHEXIDINE GLUCONATE CLOTH 2 % EX PADS
6.0000 | MEDICATED_PAD | Freq: Every day | CUTANEOUS | Status: DC
  Administered 2019-08-14 – 2019-08-15 (×2): 6 via TOPICAL

## 2019-08-14 NOTE — Op Note (Signed)
Incision and Drainage Procedure Note  Pre-operative Diagnosis: Left breast abscess  Post-operative Diagnosis: same  Indications: Persistent left breast abscess  Anesthesia: 1% lidocaine with epinephrine  Procedure Details  The procedure, risks and complications have been discussed in detail (including, but not limited to airway compromise, infection, bleeding) with the patient, and the patient has signed consent to the procedure.  The skin was sterilely prepped and draped over the affected area in the usual fashion. After adequate local anesthesia, I first attempted to aspirate pus from the site seen on ultrasound, in order to better guide the placement of my incision.  Multiple passes were made and no pus was able to be aspirated.  Therefore I aborted the procedure. The patient was observed until stable.  Findings: Unable to localize abscess cavity.  EBL: Less than 1 cc's  Drains: none  Condition: Tolerated procedure well   Complications: none.

## 2019-08-14 NOTE — Consult Note (Addendum)
Reason for Consult: Breast abscess Referring Physician: Enzo Bi, MD (hospital medicine)  Haley Olsen is an 29 y.o. female.  HPI: She came to the emergency department a week ago with a left breast abscess.  She was seen by Dr. Lysle Pearl and the plan was to admit for IR aspiration, however the patient declined to stay due to work.  She was unable to fill the prescription for clindamycin due to lack of funds and failed to follow-up with Dr. Lysle Pearl as instructed.  She came back to the emergency department last night with increased pain and size of the abscess.  She denied any fevers or chills.  No nausea or vomiting.  Of note, she has a strong history of medical noncompliance.  Interval history: Radiology did not feel the abscess required image-guided drainage.  There have been no other significant events.  Past Medical History:  Diagnosis Date  . Anxiety   . Arthritis   . Asthma   . Chlamydia   . Depression   . Diabetes mellitus without complication ([place])   . Drug addiction ([place])   . GERD (gastroesophageal reflux disease)   . Hepatitis C   . History of syphilis   . Migraine   . Seizures ([place])   . Stroke [facility])     Past Surgical History:  Procedure Laterality Date  . NOSE SURGERY     reconstructive    Family History  Problem Relation Age of Onset  . Hyperlipidemia Maternal Grandmother   . Heart disease Maternal Grandmother   . Drug abuse Maternal Grandmother   . Diabetes Maternal Grandmother   . Depression Maternal Grandmother   . Asthma Maternal Grandmother   . Heart attack Maternal Grandmother 50  . Depression Mother   . Drug abuse Mother        in recovery  . Alcohol abuse Father   . Arthritis Father   . Depression Father   . Drug abuse Father   . Hearing loss Father   . Diabetes Maternal Grandfather   . Drug abuse Maternal Grandfather   . Heart attack Maternal Grandfather   . Heart disease Maternal Grandfather   . Heart attack Paternal Grandmother 26  .  Alcohol abuse Paternal Grandmother   . Depression Paternal Grandmother     Social History:  reports that she has been smoking cigarettes. She has a 3.00 pack-year smoking history. She has never used smokeless tobacco. She reports current alcohol use. She reports previous drug use. Drugs: IV, Marijuana, and Heroin.  Allergies:  Allergies  Allergen Reactions  . Wasp Venom Protein Anaphylaxis  . Hydrocodone Hives and Itching    Medications: I have reviewed the patient's current medications.  Results for orders placed or performed during the hospital encounter of 08/12/19 (from the past 48 hour(s))  Respiratory Panel by RT PCR (Flu A&B, Covid) -     Status: None   Collection Time: 08/13/19  1:08 AM  Result Value Ref Range   SARS Coronavirus 2 by RT PCR NEGATIVE NEGATIVE    Comment: (NOTE) SARS-CoV-2 target nucleic acids are NOT DETECTED. The SARS-CoV-2 RNA is generally detectable in upper respiratoy specimens during the acute phase of infection. The lowest concentration of SARS-CoV-2 viral copies this assay can detect is 131 copies/mL. A negative result does not preclude SARS-Cov-2 infection and should not be used as the sole basis for treatment or other patient management decisions. A negative result may occur with  improper specimen collection/handling, submission of specimen other than nasopharyngeal  swab, presence of viral mutation(s) within the areas targeted by this assay, and inadequate number of viral copies (<131 copies/mL). A negative result must be combined with clinical observations, patient history, and epidemiological information. The expected result is Negative. Fact Sheet for Patients:  PinkCheek.be Fact Sheet for Healthcare Providers:  GravelBags.it This test is not yet ap proved or cleared by the [place] FDA and  has been authorized for detection and/or diagnosis of SARS-CoV-2 by FDA under an  Emergency Use Authorization (EUA). This EUA will remain  in effect (meaning this test can be used) for the duration of the COVID-19 declaration under Section 564(b)(1) of the Act, 21 U.S.C. section 360bbb-3(b)(1), unless the authorization is terminated or revoked sooner.    Influenza A by PCR NEGATIVE NEGATIVE   Influenza B by PCR NEGATIVE NEGATIVE    Comment: (NOTE) The Xpert Xpress SARS-CoV-2/FLU/RSV assay is intended as an aid in  the diagnosis of influenza from Nasopharyngeal swab specimens and  should not be used as a sole basis for treatment. Nasal washings and  aspirates are unacceptable for Xpert Xpress SARS-CoV-2/FLU/RSV  testing. Fact Sheet for Patients: PinkCheek.be Fact Sheet for Healthcare Providers: GravelBags.it This test is not yet approved or cleared by the [place] FDA and  has been authorized for detection and/or diagnosis of SARS-CoV-2 by  FDA under an Emergency Use Authorization (EUA). This EUA will remain  in effect (meaning this test can be used) for the duration of the  Covid-19 declaration under Section 564(b)(1) of the Act, 21  U.S.C. section 360bbb-3(b)(1), unless the authorization is  terminated or revoked. Performed at [facility], [address] [place], [place] [address]   Urine Drug Screen, Qualitative     Status: Abnormal   Collection Time: 08/13/19  1:08 AM  Result Value Ref Range   Tricyclic, Ur Screen NONE DETECTED NONE DETECTED   Amphetamines, Ur Screen POSITIVE (A) NONE DETECTED   MDMA (Ecstasy)Ur Screen NONE DETECTED NONE DETECTED   Cocaine Metabolite,Ur [place] NONE DETECTED NONE DETECTED   Opiate, Ur Screen NONE DETECTED NONE DETECTED   Phencyclidine (PCP) Ur S NONE DETECTED NONE DETECTED   Cannabinoid 50 Ng, Ur [place] POSITIVE (A) NONE DETECTED   Barbiturates, Ur Screen NONE DETECTED NONE DETECTED   Benzodiazepine, Ur Scrn NONE DETECTED NONE DETECTED   Methadone Scn, Ur  NONE DETECTED NONE DETECTED    Comment: (NOTE) Tricyclics + metabolites, urine    Cutoff 1000 ng/mL Amphetamines + metabolites, urine  Cutoff 1000 ng/mL MDMA (Ecstasy), urine              Cutoff 500 ng/mL Cocaine Metabolite, urine          Cutoff 300 ng/mL Opiate + metabolites, urine        Cutoff 300 ng/mL Phencyclidine (PCP), urine         Cutoff 25 ng/mL Cannabinoid, urine                 Cutoff 50 ng/mL Barbiturates + metabolites, urine  Cutoff 200 ng/mL Benzodiazepine, urine              Cutoff 200 ng/mL Methadone, urine                   Cutoff 300 ng/mL The urine drug screen provides only a preliminary, unconfirmed analytical test result and should not be used for non-medical purposes. Clinical consideration and professional judgment should be applied to any positive drug screen result due to possible interfering substances.  A more specific alternate chemical method must be used in order to obtain a confirmed analytical result. Gas chromatography / mass spectrometry (GC/MS) is the preferred confirmat ory method. Performed at [facility], [address] [place], [place] [address]   Pregnancy, urine POC     Status: None   Collection Time: 08/13/19  1:13 AM  Result Value Ref Range   Preg Test, Ur NEGATIVE NEGATIVE    Comment:        THE SENSITIVITY OF THIS METHODOLOGY IS >24 mIU/mL   CBC with Differential     Status: None   Collection Time: 08/13/19  1:31 AM  Result Value Ref Range   WBC 8.9 4.0 - 10.5 K/uL   RBC 4.26 3.87 - 5.11 MIL/uL   Hemoglobin 13.3 12.0 - 15.0 g/dL   HCT 41.3 36.0 - 46.0 %   MCV 96.9 80.0 - 100.0 fL   MCH 31.2 26.0 - 34.0 pg   MCHC 32.2 30.0 - 36.0 g/dL   RDW 13.8 11.5 - 15.5 %   Platelets 321 150 - 400 K/uL   nRBC 0.0 0.0 - 0.2 %   Neutrophils Relative % 60 %   Neutro Abs 5.2 1.7 - 7.7 K/uL   Lymphocytes Relative 31 %   Lymphs Abs 2.8 0.7 - 4.0 K/uL   Monocytes Relative 8 %   Monocytes Absolute 0.7 0.1 - 1.0 K/uL   Eosinophils  Relative 1 %   Eosinophils Absolute 0.1 0.0 - 0.5 K/uL   Basophils Relative 0 %   Basophils Absolute 0.0 0.0 - 0.1 K/uL   Immature Granulocytes 0 %   Abs Immature Granulocytes 0.03 0.00 - 0.07 K/uL    Comment: Performed at [facility], [address] [place], [place] [address]  Culture, blood (routine x 2)     Status: None (Preliminary result)   Collection Time: 08/13/19  1:51 AM   Specimen: BLOOD  Result Value Ref Range   Specimen Description BLOOD RIGHT ANTECUBITAL    Special Requests      BOTTLES DRAWN AEROBIC AND ANAEROBIC Blood Culture adequate volume   Culture      NO GROWTH 1 DAY Performed at [facility], [address], [place], [place] [address]    Report Status PENDING   Comprehensive metabolic panel     Status: Abnormal   Collection Time: 08/13/19  3:07 AM  Result Value Ref Range   Sodium 140 135 - 145 mmol/L   Potassium 3.6 3.5 - 5.1 mmol/L   Chloride 104 98 - 111 mmol/L   CO2 27 22 - 32 mmol/L   Glucose, Bld 114 (H) 70 - 99 mg/dL    Comment: Glucose reference range applies only to samples taken after fasting for at least 8 hours.   BUN 10 6 - 20 mg/dL   Creatinine, Ser 0.67 0.44 - 1.00 mg/dL   Calcium 8.7 (L) 8.9 - 10.3 mg/dL   Total Protein 7.0 6.5 - 8.1 g/dL   Albumin 3.8 3.5 - 5.0 g/dL   AST 44 (H) 15 - 41 U/L   ALT 35 0 - 44 U/L   Alkaline Phosphatase 84 38 - 126 U/L   Total Bilirubin 0.6 0.3 - 1.2 mg/dL   GFR calc non Af Amer >60 >60 mL/min   GFR calc Af Amer >60 >60 mL/min   Anion gap 9 5 - 15    Comment: Performed at [facility], [address], [place], [place] [address]  Lactic acid,  plasma     Status: Abnormal   Collection Time: 08/13/19  3:07 AM  Result Value Ref Range   Lactic Acid, Venous 2.8 (HH) 0.5 - 1.9 mmol/L    Comment: CRITICAL RESULT CALLED TO, READ BACK BY AND VERIFIED WITH VANESSA ASHLEY ON 08/13/19 AT [id number] [facility] Performed at [facility], [address], [place], [place] [address]     Magnesium     Status: None   Collection Time: 08/13/19  3:07 AM  Result Value Ref Range   Magnesium 2.0 1.7 - 2.4 mg/dL    Comment: Performed at [facility], [address], [place], [place] [address]  Phosphorus     Status: None   Collection Time: 08/13/19  3:07 AM  Result Value Ref Range   Phosphorus 3.4 2.5 - 4.6 mg/dL    Comment: Performed at [facility], [address] [place], [place] [address]  Hemoglobin A1c     Status: None   Collection Time: 08/13/19  3:07 AM  Result Value Ref Range   Hgb A1c MFr Bld 5.5 4.8 - 5.6 %    Comment: (NOTE) Pre diabetes:          5.7%-6.4% Diabetes:              >6.4% Glycemic control for   <7.0% adults with diabetes    Mean Plasma Glucose 111.15 mg/dL    Comment: Performed at [facility] [address], [place], [place] [address]  Glucose, capillary     Status: Abnormal   Collection Time: 08/13/19  5:05 AM  Result Value Ref Range   Glucose-Capillary 122 (H) 70 - 99 mg/dL    Comment: Glucose reference range applies only to samples taken after fasting for at least 8 hours.  Glucose, capillary     Status: None   Collection Time: 08/13/19  9:41 AM  Result Value Ref Range   Glucose-Capillary 98 70 - 99 mg/dL    Comment: Glucose reference range applies only to samples taken after fasting for at least 8 hours.  Culture, blood (routine x 2)     Status: None (Preliminary result)   Collection Time: 08/13/19 11:52 AM   Specimen: BLOOD  Result Value Ref Range   Specimen Description BLOOD BLOOD RIGHT HAND    Special Requests      BOTTLES DRAWN AEROBIC AND ANAEROBIC Blood Culture results may not be optimal due to an inadequate volume of blood received in culture bottles   Culture      NO GROWTH < 24 HOURS Performed at [facility], [address] [place], [place] [address]    Report Status PENDING   Glucose, capillary     Status: None   Collection Time: 08/13/19  1:50 PM  Result Value Ref Range    Glucose-Capillary 91 70 - 99 mg/dL    Comment: Glucose reference range applies only to samples taken after fasting for at least 8 hours.  Lactic acid, plasma     Status: None   Collection Time: 08/13/19  2:40 PM  Result Value Ref Range   Lactic Acid, Venous 0.8 0.5 - 1.9 mmol/L    Comment: Performed at [facility], [address] [place],  [address]  Glucose, capillary     Status: Abnormal   Collection Time: 08/13/19  8:44 PM  Result Value Ref Range   Glucose-Capillary 134 (H) 70 - 99 mg/dL    Comment: Glucose reference range applies only to samples taken  after fasting for at least 8 hours.  MRSA PCR Screening     Status: Abnormal   Collection Time: 08/14/19 12:12 AM   Specimen: Nasopharyngeal  Result Value Ref Range   MRSA by PCR POSITIVE (A) NEGATIVE    Comment:        The GeneXpert MRSA Assay (FDA approved for NASAL specimens only), is one component of a comprehensive MRSA colonization surveillance program. It is not intended to diagnose MRSA infection nor to guide or monitor treatment for MRSA infections. RESULT CALLED TO, READ BACK BY AND VERIFIED WITH: CHRISTINA [facility] @203  08/14/2019 TTG Performed at [facility], [address] [place], [place] [address]   Glucose, capillary     Status: None   Collection Time: 08/14/19  4:10 AM  Result Value Ref Range   Glucose-Capillary 99 70 - 99 mg/dL    Comment: Glucose reference range applies only to samples taken after fasting for at least 8 hours.  Basic metabolic panel     Status: Abnormal   Collection Time: 08/14/19  4:56 AM  Result Value Ref Range   Sodium 140 135 - 145 mmol/L   Potassium 4.3 3.5 - 5.1 mmol/L   Chloride 105 98 - 111 mmol/L   CO2 24 22 - 32 mmol/L   Glucose, Bld 102 (H) 70 - 99 mg/dL    Comment: Glucose reference range applies only to samples taken after fasting for at least 8 hours.   BUN 19 6 - 20 mg/dL   Creatinine, Ser 0.58 0.44 - 1.00 mg/dL   Calcium 9.1 8.9 - 10.3 mg/dL    GFR calc non Af Amer >60 >60 mL/min   GFR calc Af Amer >60 >60 mL/min   Anion gap 11 5 - 15    Comment: Performed at [facility], [address] [place], [place] [address]  CBC     Status: None   Collection Time: 08/14/19  4:56 AM  Result Value Ref Range   WBC 6.7 4.0 - 10.5 K/uL   RBC 4.37 3.87 - 5.11 MIL/uL   Hemoglobin 13.6 12.0 - 15.0 g/dL   HCT 42.2 36.0 - 46.0 %   MCV 96.6 80.0 - 100.0 fL   MCH 31.1 26.0 - 34.0 pg   MCHC 32.2 30.0 - 36.0 g/dL   RDW 13.6 11.5 - 15.5 %   Platelets 288 150 - 400 K/uL   nRBC 0.0 0.0 - 0.2 %    Comment: Performed at [facility], [address], [place], [place] [address]  Magnesium     Status: None   Collection Time: 08/14/19  4:56 AM  Result Value Ref Range   Magnesium 2.2 1.7 - 2.4 mg/dL    Comment: Performed at [facility], [address] [place], [place] [address]  Glucose, capillary     Status: None   Collection Time: 08/14/19  7:57 AM  Result Value Ref Range   Glucose-Capillary 83 70 - 99 mg/dL    Comment: Glucose reference range applies only to samples taken after fasting for at least 8 hours.  Glucose, capillary     Status: Abnormal   Collection Time: 08/14/19 11:44 AM  Result Value Ref Range   Glucose-Capillary 148 (H) 70 - 99 mg/dL    Comment: Glucose reference range applies only to samples taken after fasting for at least 8 hours.    US BREAST LTD UNI LEFT INC AXILLA  Result Date: 08/13/2019 CLINICAL DATA:  Left breast abscess for sinus  EXAM: LIMITED ULTRASOUND OF THE LEFT BREAST COMPARISON:  Previous exam(s). FINDINGS: Targeted left breast ultrasound was performed of the retroareolar area from 12-2 o'clock. There is redemonstration of a focal 4.0 x 1.1 x 2.9 cm heterogeneous fluid collection with surrounding hypervascularity. Overlying skin appears intact with mild skin thickening and hyperemia which could reflect cellulitis. IMPRESSION: Findings most compatible with a 4 x 1.1 x 2.9 cm abscess in  the upper-outer quadrant of the left breast with overlying skin changes suggestive of cellulitis. RECOMMENDATION: 1. If a breast abscess, or possible abscess, is demonstrated by ultrasound in the absence of systemic illness, non-intact overlying skin, or other clinical features requiring hospital admission, it is recommended that patient be started on an appropriate course of antibiotics and referred to the [place] the following morning for possible abscess drainage. 2. If patient does not have a primary care physician and is to be referred to the [place], patient is to be first referred the following morning to the [place] who has agreed to aid in the outpatient management ([address] (phone# [phone]). Recommended antibiotics for outpatient treatment of breast abscess: No MRSA risk 1. Keflex 500 mg po qid 2. Clindamycin 300 mg po tid MRSA risk 1. Bactrim 2 tabs po bid 2. Doxycycline 100 mg po bid Subareolar abscess, recurrent abscess, nipple retraction 1. Augmentin 875 mg po bid 2. Clindamycin 300 mg po tid These results and recommendations were called by telephone at the time of interpretation on 08/13/2019 at 1:03 am to provider JADE SUNG , who verbally acknowledged these results. BI-RADS CATEGORY  3: Probably benign. Electronically Signed   By: Lovena Le M.D.   On: 08/13/2019 01:03    Review of Systems  All other systems reviewed and are negative.  Blood pressure 121/82, pulse (!) 108, temperature 98.3 F (36.8 C), resp. rate 17, height 5\' 4"  (1.626 m), weight 59.9 kg, SpO2 98 %. Physical Exam  Constitutional: She is oriented to person, place, and time. She appears well-developed and well-nourished. No distress.  HENT:  Head: Normocephalic and atraumatic.  Mouth/Throat: No oropharyngeal exudate.  Eyes: Right eye exhibits no discharge. Left eye exhibits no discharge. No scleral icterus.  Cardiovascular:  Normal rate and regular rhythm.  Respiratory: Effort normal. No respiratory distress. Left breast exhibits skin change.    There is induration and skin change between 10 and 2:00 just above the areola of the left breast.  The skin is peeling in the area is somewhat tender and indurated.  GI: Soft.  Genitourinary:    Genitourinary Comments: Deferred   Musculoskeletal:        General: No edema. Normal range of motion.  Neurological: She is alert and oriented to person, place, and time.  Skin: Skin is warm and dry.  Psychiatric:  She is fairly tangential as she relates the history    Assessment/Plan: This is a 29 year old woman who presented to the emergency department about a week ago with a left breast abscess.  At that time, the plan was for an aspiration by interventional radiology and antibiotics.  She left before she could be treated, failed to fill her antibiotic prescription, and did not follow-up with general surgery either.  She returned with worsening symptoms last night.  On review of the imaging, this is fairly superficial and could easily be drained at the bedside.  The patient expressed a preference for it to be simply aspirated so that  she would not have to deal with packing the wound or have an open wound when she is able to go back to work (she works as a Games developer).  I discussed this with interventional radiology who felt that aspiration would be a suboptimal treatment and that incision and drainage would be preferred.   Today, I attempted a bedside aspiration with incision and drainage, using the ultrasound images for guidance.  Despite multiple attempts, I was unable to  aspirate back any pus, therefore I elected not to make a large incision that may have been low yield.  I would recommend continuing to treat with antibiotics.  The patient should follow-up as discussed above in the ultrasound report:  " RECOMMENDATION: 1. If a breast abscess, or possible abscess, is  demonstrated by ultrasound in the absence of systemic illness, non-intact overlying skin, or other clinical features requiring hospital admission, it is recommended that patient be started on an appropriate course of antibiotics and referred to the [place] the following morning for possible abscess drainage. 2. If patient does not have a primary care physician and is to be referred to the [place], patient is to be first referred the following morning to the [place] who has agreed to aid in the outpatient management ([address] (phone# [phone]). Recommended antibiotics for outpatient treatment of breast abscess: No MRSA risk 1. Keflex 500 mg po qid 2. Clindamycin 300 mg po tid MRSA risk 1. Bactrim 2 tabs po bid 2. Doxycycline 100 mg po bid Subareolar abscess, recurrent abscess, nipple retraction 1. Augmentin 875 mg po bid 2. Clindamycin 300 mg po tid."  Fredirick Maudlin 08/14/2019, 3:07 PM

## 2019-08-14 NOTE — Progress Notes (Signed)
PROGRESS NOTE    Haley Olsen  [id number] DOB: Oct 10, 1990 DOA: 08/12/2019 PCP: Lesleigh Noe, MD    Assessment & Plan:   Principal Problem:   Abscess of left breast Active Problems:   Seizures ([place])   History of heroin abuse ([place])   Depression, recurrent ([place])   Type 2 diabetes mellitus without complication, without long-term current use of insulin (HCC)   Excessive drinking of alcohol   History of CVA (cerebrovascular accident)   Left breast abscess   Anxiety   Lactic acidosis    Haley Olsen is a 29 y.o. Caucasian female with medical history significant for type 2 diabetes, IV drug use in remission, history of CVA without residual deficit and seizures, heavy alcohol use, recently seen in the ER on 3/14 for left breast abscess seen by surgery with recommendation for aspiration by IR but who did not stay for treatment, who returns to the ER today with worsening left breast pain and an enlarging area of redness. She was prescribed antibiotics during her recent visit but did not fill the prescription.      Abscess and cellulitis of left breast --Repeat ultrasound of the breast shows a 4 x 1.1 x 2.9 cm abscess in the upper outer quadrant of the left breast with overlying skin changes suggestive of cellulitis. --IR had declined aspiration. PLAN: -continue IV clindamycin -Pain control -Surgery attempted bedside I/D today, but wasn't able to express pus. --Follow up with [facility], per Surgery rec.    Seizures ([place]) -Noncompliant with Keppra. Says it makes her feel weak    History of heroin abuse ([place]) -Patient said that she quit using heroin 8 months ago but use it once 1 month ago and did not like the way she felt    Type 2 diabetes mellitus without complication, without long-term current use of insulin (HCC) -Hemoglobin A1c 5.5, wnl. -d/c fingersticks    Excessive drinking of alcohol -She has been drinking a little less  than half pint of liquor daily to treat her anxiety -CIWA withdrawal protocol    History of CVA (cerebrovascular accident) -No residual deficits  Anxiety and insomnia  --Atarax PRN --Trazodone nightly PRN   Tobacco abuse --Nicotine patch   DVT prophylaxis: Lovenox SQ Code Status: Full code  Family Communication:  Disposition Plan: from home, back to home, likely tomorrow, after figuring out who is going to treat pt's abscess (IR declined over the weekend, and Surgery wasn't successful).     Subjective and Interval History:  Pt was very sleepy this morning, and didn't answer questions.  No noted fever, N/V/D.  Surgery attempted bedside I/D, but wasn't able to express any pus.     Objective: Vitals:   08/13/19 0442 08/13/19 1354 08/14/19 0029 08/14/19 0754  BP: 102/79 104/74 123/75 121/82  Pulse: 94 86 (!) 107 (!) 108  Resp: 16 17 20 17   Temp:  98 F (36.7 C) 97.6 F (36.4 C) 98.3 F (36.8 C)  TempSrc:  Oral Oral   SpO2: 97% 98% 100% 98%  Weight:      Height:        Intake/Output Summary (Last 24 hours) at 08/14/2019 1445 Last data filed at 08/14/2019 0604 Gross per 24 hour  Intake 105.26 ml  Output --  Net 105.26 ml   Filed Weights   08/12/19 1734  Weight: 59.9 kg    Examination:   Constitutional: NAD, sleepy HEENT: conjunctivae and lids normal, EOMI CV: RRR no M,R,G.  Distal pulses +2.  No cyanosis.   RESP: CTA B/L, normal respiratory effort  GI: +BS, NTND Extremities: No effusions, edema, or tenderness in BLE SKIN: warm, dry.  Erythema around the nipple area of the left breast Neuro: II - XII grossly intact.  Sensation intact Psych: Depressed mood and affect.     Data Reviewed: I have personally reviewed following labs and imaging studies  CBC: Recent Labs  Lab 08/13/19 0131 08/14/19 0456  WBC 8.9 6.7  NEUTROABS 5.2  --   HGB 13.3 13.6  HCT 41.3 42.2  MCV 96.9 96.6  PLT 321 [id number]   Basic Metabolic Panel: Recent Labs  Lab 08/13/19 0307  08/14/19 0456  NA 140 140  K 3.6 4.3  CL 104 105  CO2 27 24  GLUCOSE 114* 102*  BUN 10 19  CREATININE 0.67 0.58  CALCIUM 8.7* 9.1  MG 2.0 2.2  PHOS 3.4  --    GFR: Estimated Creatinine Clearance: 90.4 mL/min (by C-G formula based on SCr of 0.58 mg/dL). Liver Function Tests: Recent Labs  Lab 08/13/19 0307  AST 44*  ALT 35  ALKPHOS 84  BILITOT 0.6  PROT 7.0  ALBUMIN 3.8   No results for input(s): LIPASE, AMYLASE in the last 168 hours. No results for input(s): AMMONIA in the last 168 hours. Coagulation Profile: No results for input(s): INR, PROTIME in the last 168 hours. Cardiac Enzymes: No results for input(s): CKTOTAL, CKMB, CKMBINDEX, TROPONINI in the last 168 hours. BNP (last 3 results) No results for input(s): PROBNP in the last 8760 hours. HbA1C: Recent Labs    08/13/19 0307  HGBA1C 5.5   CBG: Recent Labs  Lab 08/13/19 1350 08/13/19 2044 08/14/19 0410 08/14/19 0757 08/14/19 1144  GLUCAP 91 134* 99 83 148*   Lipid Profile: No results for input(s): CHOL, HDL, LDLCALC, TRIG, CHOLHDL, LDLDIRECT in the last 72 hours. Thyroid Function Tests: No results for input(s): TSH, T4TOTAL, FREET4, T3FREE, THYROIDAB in the last 72 hours. Anemia Panel: No results for input(s): VITAMINB12, FOLATE, FERRITIN, TIBC, IRON, RETICCTPCT in the last 72 hours. Sepsis Labs: Recent Labs  Lab 08/13/19 0307 08/13/19 1440  LATICACIDVEN 2.8* 0.8    Recent Results (from the past 240 hour(s))  Respiratory Panel by RT PCR (Flu A&B, Covid) -     Status: None   Collection Time: 08/13/19  1:08 AM  Result Value Ref Range Status   SARS Coronavirus 2 by RT PCR NEGATIVE NEGATIVE Final    Comment: (NOTE) SARS-CoV-2 target nucleic acids are NOT DETECTED. The SARS-CoV-2 RNA is generally detectable in upper respiratoy specimens during the acute phase of infection. The lowest concentration of SARS-CoV-2 viral copies this assay can detect is 131 copies/mL. A negative result does not  preclude SARS-Cov-2 infection and should not be used as the sole basis for treatment or other patient management decisions. A negative result may occur with  improper specimen collection/handling, submission of specimen other than nasopharyngeal swab, presence of viral mutation(s) within the areas targeted by this assay, and inadequate number of viral copies (<131 copies/mL). A negative result must be combined with clinical observations, patient history, and epidemiological information. The expected result is Negative. Fact Sheet for Patients:  PinkCheek.be Fact Sheet for Healthcare Providers:  GravelBags.it This test is not yet ap proved or cleared by the [place] FDA and  has been authorized for detection and/or diagnosis of SARS-CoV-2 by FDA under an Emergency Use Authorization (EUA). This EUA will remain  in effect (meaning this test  can be used) for the duration of the COVID-19 declaration under Section 564(b)(1) of the Act, 21 U.S.C. section 360bbb-3(b)(1), unless the authorization is terminated or revoked sooner.    Influenza A by PCR NEGATIVE NEGATIVE Final   Influenza B by PCR NEGATIVE NEGATIVE Final    Comment: (NOTE) The Xpert Xpress SARS-CoV-2/FLU/RSV assay is intended as an aid in  the diagnosis of influenza from Nasopharyngeal swab specimens and  should not be used as a sole basis for treatment. Nasal washings and  aspirates are unacceptable for Xpert Xpress SARS-CoV-2/FLU/RSV  testing. Fact Sheet for Patients: PinkCheek.be Fact Sheet for Healthcare Providers: GravelBags.it This test is not yet approved or cleared by the [place] FDA and  has been authorized for detection and/or diagnosis of SARS-CoV-2 by  FDA under an Emergency Use Authorization (EUA). This EUA will remain  in effect (meaning this test can be used) for the duration of the    Covid-19 declaration under Section 564(b)(1) of the Act, 21  U.S.C. section 360bbb-3(b)(1), unless the authorization is  terminated or revoked. Performed at [facility], [address] [place], [place] [address]   Culture, blood (routine x 2)     Status: None (Preliminary result)   Collection Time: 08/13/19  1:51 AM   Specimen: BLOOD  Result Value Ref Range Status   Specimen Description BLOOD RIGHT ANTECUBITAL  Final   Special Requests   Final    BOTTLES DRAWN AEROBIC AND ANAEROBIC Blood Culture adequate volume   Culture   Final    NO GROWTH 1 DAY Performed at [facility], [address] [place], [place] [address]    Report Status PENDING  Incomplete  Culture, blood (routine x 2)     Status: None (Preliminary result)   Collection Time: 08/13/19 11:52 AM   Specimen: BLOOD  Result Value Ref Range Status   Specimen Description BLOOD BLOOD RIGHT HAND  Final   Special Requests   Final    BOTTLES DRAWN AEROBIC AND ANAEROBIC Blood Culture results may not be optimal due to an inadequate volume of blood received in culture bottles   Culture   Final    NO GROWTH < 24 HOURS Performed at [facility], [address], [place], [place] [address]    Report Status PENDING  Incomplete  MRSA PCR Screening     Status: Abnormal   Collection Time: 08/14/19 12:12 AM   Specimen: Nasopharyngeal  Result Value Ref Range Status   MRSA by PCR POSITIVE (A) NEGATIVE Final    Comment:        The GeneXpert MRSA Assay (FDA approved for NASAL specimens only), is one component of a comprehensive MRSA colonization surveillance program. It is not intended to diagnose MRSA infection nor to guide or monitor treatment for MRSA infections. RESULT CALLED TO, READ BACK BY AND VERIFIED WITH: CHRISTINA [facility] @203  08/14/2019 TTG Performed at [facility], [address] [place], [place] [address]       Radiology Studies: US BREAST LTD UNI LEFT INC AXILLA  Result  Date: 08/13/2019 CLINICAL DATA:  Left breast abscess for sinus EXAM: LIMITED ULTRASOUND OF THE LEFT BREAST COMPARISON:  Previous exam(s). FINDINGS: Targeted left breast ultrasound was performed of the retroareolar area from 12-2 o'clock. There is redemonstration of a focal 4.0 x 1.1 x 2.9 cm heterogeneous fluid collection with surrounding hypervascularity. Overlying skin appears intact with mild skin thickening and hyperemia which could reflect cellulitis. IMPRESSION: Findings most compatible with a 4 x  1.1 x 2.9 cm abscess in the upper-outer quadrant of the left breast with overlying skin changes suggestive of cellulitis. RECOMMENDATION: 1. If a breast abscess, or possible abscess, is demonstrated by ultrasound in the absence of systemic illness, non-intact overlying skin, or other clinical features requiring hospital admission, it is recommended that patient be started on an appropriate course of antibiotics and referred to the [facility] the following morning for possible abscess drainage. 2. If patient does not have a primary care physician and is to be referred to the [place] [facility] [place], patient is to be first referred the following morning to the [facility] who has agreed to aid in the outpatient management ([address]) (phone# [phone]). Recommended antibiotics for outpatient treatment of breast abscess: No MRSA risk 1. Keflex 500 mg po qid 2. Clindamycin 300 mg po tid MRSA risk 1. Bactrim 2 tabs po bid 2. Doxycycline 100 mg po bid Subareolar abscess, recurrent abscess, nipple retraction 1. Augmentin 875 mg po bid 2. Clindamycin 300 mg po tid These results and recommendations were called by telephone at the time of interpretation on 08/13/2019 at 1:03 am to provider JADE SUNG , who verbally acknowledged these results. BI-RADS CATEGORY  3: Probably benign. Electronically Signed   By: Lovena Le M.D.   On: 08/13/2019 01:03      Scheduled Meds: . Chlorhexidine Gluconate Cloth  6 each Topical Q0600  . enoxaparin (LOVENOX) injection  40 mg Subcutaneous Q24H  . folic acid  1 mg Oral Daily  . lidocaine-EPINEPHrine  20 mL Infiltration Once  . LORazepam  0-4 mg Intravenous Q6H   Followed by  . [START ON 08/15/2019] LORazepam  0-4 mg Intravenous Q12H  . mupirocin ointment  1 application Nasal BID  . nicotine  21 mg Transdermal Daily  . nicotine  21 mg Transdermal NOW  . thiamine  100 mg Oral Daily   Or  . thiamine  100 mg Intravenous Daily   Continuous Infusions: . clindamycin (CLEOCIN) IV Stopped (08/14/19 0553)     LOS: 1 day     Enzo Bi, MD [facility] If 7PM-7AM, please contact night-coverage 08/14/2019, 2:45 PM

## 2019-08-15 ENCOUNTER — Inpatient Hospital Stay: Payer: Self-pay

## 2019-08-15 LAB — BASIC METABOLIC PANEL
Anion gap: 8 (ref 5–15)
BUN: 15 mg/dL (ref 6–20)
CO2: 23 mmol/L (ref 22–32)
Calcium: 8.8 mg/dL — ABNORMAL LOW (ref 8.9–10.3)
Chloride: 104 mmol/L (ref 98–111)
Creatinine, Ser: 0.61 mg/dL (ref 0.44–1.00)
GFR calc Af Amer: >60 mL/min (ref >60)
GFR calc non Af Amer: >60 mL/min (ref >60)
Glucose, Bld: 109 mg/dL — ABNORMAL HIGH (ref 70–99)
Potassium: 4 mmol/L (ref 3.5–5.1)
Sodium: 135 mmol/L (ref 135–145)

## 2019-08-15 LAB — CBC
HCT: 43 % (ref 36.0–46.0)
Hemoglobin: 13.8 g/dL (ref 12.0–15.0)
MCH: 31.4 pg (ref 26.0–34.0)
MCHC: 32.1 g/dL (ref 30.0–36.0)
MCV: 97.7 fL (ref 80.0–100.0)
Platelets: 292 10*3/uL (ref 150–400)
RBC: 4.4 MIL/uL (ref 3.87–5.11)
RDW: 13.6 % (ref 11.5–15.5)
WBC: 6.9 10*3/uL (ref 4.0–10.5)
nRBC: 0 % (ref 0.0–0.2)

## 2019-08-15 LAB — GLUCOSE, CAPILLARY
Glucose-Capillary: 100 mg/dL — ABNORMAL HIGH (ref 70–99)
Glucose-Capillary: 105 mg/dL — ABNORMAL HIGH (ref 70–99)
Glucose-Capillary: 123 mg/dL — ABNORMAL HIGH (ref 70–99)
Glucose-Capillary: 133 mg/dL — ABNORMAL HIGH (ref 70–99)
Glucose-Capillary: 83 mg/dL (ref 70–99)
Glucose-Capillary: 96 mg/dL (ref 70–99)

## 2019-08-15 LAB — MAGNESIUM: Magnesium: 2 mg/dL (ref 1.7–2.4)

## 2019-08-15 MED ORDER — MORPHINE SULFATE (PF) 4 MG/ML IV SOLN
4.0000 mg | Freq: Once | INTRAVENOUS | Status: AC
  Administered 2019-08-15: 4 mg via INTRAVENOUS
  Filled 2019-08-15: qty 1

## 2019-08-15 MED ORDER — OXYCODONE HCL 5 MG PO TABS: 5.0000 mg | ORAL_TABLET | Freq: Once | ORAL | Status: DC

## 2019-08-15 NOTE — Discharge Summary (Signed)
Physician Discharge Summary   [place]  female DOB: 1991/01/28  [id number]  PCP: Haley Noe, MD  Admit date: 08/12/2019 Discharge date: 08/15/2019  Admitted From: home Disposition:  home CODE STATUS: Full code  Discharge Instructions    Discharge instructions   Complete by: As directed    Please finish taking the oral Clindamycin that was prescribed to you before.   Please follow up with your PCP to ensure resolution of the abscess.   Dr. Enzo Olsen [facility] Course:  For full details, please see H&P, progress notes, consult notes and ancillary notes.  Briefly,  Haley Olsen Pegramis a 29 y.o.Caucasian femalewith medical history significant fortype 2 diabetes, IV drug use, CVA without residual deficit and seizures, heavy alcohol use, recently seen in the ER on 3/14for left breast abscess seen by surgery with recommendation for aspiration by IR but who did not stay for treatment.  Pt returned to the ER on 08/12/19 with worsening left breast pain and an enlarging area of redness. She was prescribed antibiotics during her recent visit but did not fill the prescription.    Abscess and cellulitis of left breast Repeat ultrasound of the breast shows a 4 x 1.1 x 2.9 cm abscess in the upper outer quadrant of the left breast with overlying skin changes suggestive of cellulitis.  Pt was started on IV clinda on presentation.  IR had declined aspiration, so GenSurg was consulted who attempted bedside I/D, but wasn't able to express any pus.  IR re-consulted and a breast radiologist performed US-guided aspiration and still didn't get return of pus.  The collection likely became phlegmon and is not currently drainable.  Pt was discharged on PO clinda (previously prescribed and filled by pt) and recommend followup imaging as outpatient to ensure resolution of the abscess/phlegmon.  Seizures ([place]) Noncompliant with Keppra.  Haley Olsen it made her feel  weak  History of heroin abuse [facility]) Patient said that she quit using heroin 8 months ago but used it once 1 month ago and did not like the way she felt.  Type 2 diabetes mellitus without complication, without long-term current use of insulin (HCC) Hemoglobin A1c 5.5, wnl.  No need for fingersticks or SSI.  Excessive drinking of alcohol She reported drinking a little less than half pint of liquor dailyto treat her anxiety.  History of CVA (cerebrovascular accident) No residual deficits  Anxiety and insomnia  Atarax PRN and Trazodone nightly PRN/  Tobacco abuse Nicotine patch   Discharge Diagnoses:  Principal Problem:   Abscess of left breast Active Problems:   Seizures (HCC)   History of heroin abuse (HCC)   Depression, recurrent (HCC)   Type 2 diabetes mellitus without complication, without long-term current use of insulin (HCC)   Excessive drinking of alcohol   History of CVA (cerebrovascular accident)   Left breast abscess   Anxiety   Lactic acidosis    Discharge Instructions:  Allergies as of 08/15/2019      Reactions   Wasp Venom Protein Anaphylaxis   Hydrocodone Hives, Itching      Medication List    TAKE these medications   clindamycin 300 MG capsule Commonly known as: CLEOCIN Take 1 capsule (300 mg total) by mouth 3 (three) times daily for 10 days.       Follow-up Information    Haley Noe, MD. Schedule an appointment as soon as possible for a visit in 1 week(s).  Specialty: Family Medicine Contact information: [address] [place] [address] [phone]           Allergies  Allergen Reactions  . Wasp Venom Protein Anaphylaxis  . Hydrocodone Hives and Itching     The results of significant diagnostics from this hospitalization (including imaging, microbiology, ancillary and laboratory) are listed below for reference.   Consultations:   Procedures/Studies: US BREAST LTD UNI LEFT INC AXILLA  Result  Date: 08/13/2019 CLINICAL DATA:  Left breast abscess for sinus EXAM: LIMITED ULTRASOUND OF THE LEFT BREAST COMPARISON:  Previous exam(s). FINDINGS: Targeted left breast ultrasound was performed of the retroareolar area from 12-2 o'clock. There is redemonstration of a focal 4.0 x 1.1 x 2.9 cm heterogeneous fluid collection with surrounding hypervascularity. Overlying skin appears intact with mild skin thickening and hyperemia which could reflect cellulitis. IMPRESSION: Findings most compatible with a 4 x 1.1 x 2.9 cm abscess in the upper-outer quadrant of the left breast with overlying skin changes suggestive of cellulitis. RECOMMENDATION: 1. If a breast abscess, or possible abscess, is demonstrated by ultrasound in the absence of systemic illness, non-intact overlying skin, or other clinical features requiring hospital admission, it is recommended that patient be started on an appropriate course of antibiotics and referred to the [facility] the following morning for possible abscess drainage. 2. If patient does not have a primary care physician and is to be referred to the [facility], patient is to be first referred the following morning to the [facility] who has agreed to aid in the outpatient management ([address]) (phone# [phone]). Recommended antibiotics for outpatient treatment of breast abscess: No MRSA risk 1. Keflex 500 mg po qid 2. Clindamycin 300 mg po tid MRSA risk 1. Bactrim 2 tabs po bid 2. Doxycycline 100 mg po bid Subareolar abscess, recurrent abscess, nipple retraction 1. Augmentin 875 mg po bid 2. Clindamycin 300 mg po tid These results and recommendations were called by telephone at the time of interpretation on 08/13/2019 at 1:03 am to provider Haley Olsen , who verbally acknowledged these results. Olsen-RADS CATEGORY  3: Probably benign. Electronically Signed   By: Haley Olsen M.D.   On: 08/13/2019 01:03   US  BREAST LTD UNI LEFT INC AXILLA  Result Date: 08/07/2019 CLINICAL DATA:  29 year old female with LEFT periareolar pain, redness and swelling for 1 week. EXAM: ULTRASOUND OF THE LEFT BREAST COMPARISON:  None FINDINGS: Targeted ultrasound is performed, showing a 3.7 x 1.5 x 2.8 cm collection/abscess in the superficial aspect of the LEFT RETROAREOLAR breast from the 12 to 2 o'clock position. IMPRESSION: 3.7 x 1.5 x 2.8 cm UPPER OUTER LEFT RETROAREOLAR breast abscess. RECOMMENDATION: Clinical follow-up. Electronically Signed   By: Margarette Canada M.D.   On: 08/07/2019 16:29   US BREAST ASPIRATION LEFT  Result Date: 08/15/2019 CLINICAL DATA:  The patient presents for a possible abscess aspiration. EXAM: ULTRASOUND GUIDED LEFT BREAST ABSCESS ASPIRATION COMPARISON:  Previous exams. PROCEDURE: Using sterile technique, 1% lidocaine, under direct ultrasound visualization, attempted needle aspiration of a left retroareolar abscess was performed. IMPRESSION: Attempted ultrasound-guided aspiration of a potential left retroareolar abscess. No pus could be aspirated. The collection is likely a phlegmon and is not currently drainable. RECOMMENDATIONS: Recommend continued treatment with antibiotics. The patient states she is getting better on antibiotics. Recommend follow-up imaging to ensure complete resolution of the sonographic findings. Electronically Signed   By: Dorise Bullion III M.D  On: 08/15/2019 12:31      Labs: BNP (last 3 results) Recent Labs    12/12/18 1529  BNP 123456   Basic Metabolic Panel: Recent Labs  Lab 08/13/19 0307 08/14/19 0456 08/15/19 0545  NA 140 140 135  K 3.6 4.3 4.0  CL 104 105 104  CO2 27 24 23   GLUCOSE 114* 102* 109*  BUN 10 19 15   CREATININE 0.67 0.58 0.61  CALCIUM 8.7* 9.1 8.8*  MG 2.0 2.2 2.0  PHOS 3.4  --   --    Liver Function Tests: Recent Labs  Lab 08/13/19 0307  AST 44*  ALT 35  ALKPHOS 84  BILITOT 0.6  PROT 7.0  ALBUMIN 3.8   No results for input(s):  LIPASE, AMYLASE in the last 168 hours. No results for input(s): AMMONIA in the last 168 hours. CBC: Recent Labs  Lab 08/13/19 0131 08/14/19 0456 08/15/19 0545  WBC 8.9 6.7 6.9  NEUTROABS 5.2  --   --   HGB 13.3 13.6 13.8  HCT 41.3 42.2 43.0  MCV 96.9 96.6 97.7  PLT 321 288 292   Cardiac Enzymes: No results for input(s): CKTOTAL, CKMB, CKMBINDEX, TROPONINI in the last 168 hours. BNP: Invalid input(s): POCBNP CBG: Recent Labs  Lab 08/14/19 1957 08/15/19 0004 08/15/19 0417 08/15/19 0754 08/15/19 1333  GLUCAP 127* 105* 123* 100* 133*   D-Dimer No results for input(s): DDIMER in the last 72 hours. Hgb A1c Recent Labs    08/13/19 0307  HGBA1C 5.5   Lipid Profile No results for input(s): CHOL, HDL, LDLCALC, TRIG, CHOLHDL, LDLDIRECT in the last 72 hours. Thyroid function studies No results for input(s): TSH, T4TOTAL, T3FREE, THYROIDAB in the last 72 hours.  Invalid input(s): FREET3 Anemia work up No results for input(s): VITAMINB12, FOLATE, FERRITIN, TIBC, IRON, RETICCTPCT in the last 72 hours. Urinalysis    Component Value Date/Time   COLORURINE YELLOW 12/12/2018 1529   APPEARANCEUR CLEAR 12/12/2018 1529   LABSPEC 1.021 12/12/2018 1529   PHURINE 5.0 12/12/2018 1529   GLUCOSEU NEGATIVE 12/12/2018 1529   HGBUR SMALL (A) 12/12/2018 1529   BILIRUBINUR NEGATIVE 12/12/2018 1529   KETONESUR 80 (A) 12/12/2018 1529   PROTEINUR 100 (A) 12/12/2018 1529   UROBILINOGEN 0.2 07/14/2011 1800   NITRITE NEGATIVE 12/12/2018 1529   LEUKOCYTESUR NEGATIVE 12/12/2018 1529   Sepsis Labs Invalid input(s): PROCALCITONIN,  WBC,  LACTICIDVEN Microbiology Recent Results (from the past 240 hour(s))  Respiratory Panel by RT PCR (Flu A&B, Covid) -     Status: None   Collection Time: 08/13/19  1:08 AM  Result Value Ref Range Status   SARS Coronavirus 2 by RT PCR NEGATIVE NEGATIVE Final    Comment: (NOTE) SARS-CoV-2 target nucleic acids are NOT DETECTED. The SARS-CoV-2 RNA is generally  detectable in upper respiratoy specimens during the acute phase of infection. The lowest concentration of SARS-CoV-2 viral copies this assay can detect is 131 copies/mL. A negative result does not preclude SARS-Cov-2 infection and should not be used as the sole basis for treatment or other patient management decisions. A negative result may occur with  improper specimen collection/handling, submission of specimen other than nasopharyngeal swab, presence of viral mutation(s) within the areas targeted by this assay, and inadequate number of viral copies (<131 copies/mL). A negative result must be combined with clinical observations, patient history, and epidemiological information. The expected result is Negative. Fact Sheet for Patients:  PinkCheek.be Fact Sheet for Healthcare Providers:  GravelBags.it This test is not yet ap proved or  cleared by the [place] and  has been authorized for detection and/or diagnosis of SARS-CoV-2 by FDA under an Emergency Use Authorization (EUA). This EUA will remain  in effect (meaning this test can be used) for the duration of the COVID-19 declaration under Section 564(b)(1) of the Act, 21 U.S.C. section 360bbb-3(b)(1), unless the authorization is terminated or revoked sooner.    Influenza A by PCR NEGATIVE NEGATIVE Final   Influenza B by PCR NEGATIVE NEGATIVE Final    Comment: (NOTE) The Xpert Xpress SARS-CoV-2/FLU/RSV assay is intended as an aid in  the diagnosis of influenza from Nasopharyngeal swab specimens and  should not be used as a sole basis for treatment. Nasal washings and  aspirates are unacceptable for Xpert Xpress SARS-CoV-2/FLU/RSV  testing. Fact Sheet for Patients: PinkCheek.be Fact Sheet for Healthcare Providers: GravelBags.it This test is not yet approved or cleared by the [place] FDA and  has been  authorized for detection and/or diagnosis of SARS-CoV-2 by  FDA under an Emergency Use Authorization (EUA). This EUA will remain  in effect (meaning this test can be used) for the duration of the  Covid-19 declaration under Section 564(b)(1) of the Act, 21  U.S.C. section 360bbb-3(b)(1), unless the authorization is  terminated or revoked. Performed at [facility], [address] [place], [place] [address]   Culture, blood (routine x 2)     Status: None (Preliminary result)   Collection Time: 08/13/19  1:51 AM   Specimen: BLOOD  Result Value Ref Range Status   Specimen Description BLOOD RIGHT ANTECUBITAL  Final   Special Requests   Final    BOTTLES DRAWN AEROBIC AND ANAEROBIC Blood Culture adequate volume   Culture   Final    NO GROWTH 2 DAYS Performed at [facility], [address] [place], [place] [address]    Report Status PENDING  Incomplete  Culture, blood (routine x 2)     Status: None (Preliminary result)   Collection Time: 08/13/19 11:52 AM   Specimen: BLOOD  Result Value Ref Range Status   Specimen Description BLOOD BLOOD RIGHT HAND  Final   Special Requests   Final    BOTTLES DRAWN AEROBIC AND ANAEROBIC Blood Culture results may not be optimal due to an inadequate volume of blood received in culture bottles   Culture   Final    NO GROWTH 2 DAYS Performed at [facility], [address], [place], [place] [address]    Report Status PENDING  Incomplete  MRSA PCR Screening     Status: Abnormal   Collection Time: 08/14/19 12:12 AM   Specimen: Nasopharyngeal  Result Value Ref Range Status   MRSA by PCR POSITIVE (A) NEGATIVE Final    Comment:        The GeneXpert MRSA Assay (FDA approved for NASAL specimens only), is one component of a comprehensive MRSA colonization surveillance program. It is not intended to diagnose MRSA infection nor to guide or monitor treatment for MRSA infections. RESULT CALLED TO, READ BACK BY AND VERIFIED  WITH: Johnson @203  08/14/2019 TTG Performed at [facility], [address] [place], [place] [address]      Total time spend on discharging this patient, including the last patient exam, discussing the hospital stay, instructions for ongoing care as it relates to all pertinent caregivers, as well as preparing the medical discharge records, prescriptions, and/or referrals as applicable, is 40 minutes.    Haley Bi, MD  [facility] 08/15/2019, 4:08  PM  If 7PM-7AM, please contact night-coverage

## 2019-08-15 NOTE — Progress Notes (Signed)
Discharge instructions reviewed with patient(for primary RN, Debi) using teachback method, aftercare instructions provided, follow up reminders given. PIV removed. Questions encouraged and answered. Pt left with all belongings agreeing that all items are present.

## 2019-08-15 NOTE — TOC Initial Note (Signed)
Transition of Care [facility]) - Initial/Assessment Note    Patient Details  Name: Haley Olsen MRN: [id number] Date of Birth: Oct 31, 1990  [facility]) CM/SW Contact:    Shelbie Hutching, RN Phone Number: 08/15/2019, 4:53 PM  Clinical Narrative:                 Patient has been medically cleared for discharge after being admitted for a breast abscess.  Patient is from home in [place], where she says she lives with a roommate.  The roommate will be coming to pick her up.  Patient does not have insurance and currently does not have a PCP.  RNCM received a consult for substance abuse counseling.  Patient presented with information about local programs that are available for substance abuse treatment; such as outpatient therapy and peer support groups.  Patient denies current use of IV drugs and reports only occasional alcohol use.   Patient also referred to [facility] and application given for her to fill out and take with her to the clinic.   No other discharge needs identified at this time.   Expected Discharge Plan: Home/Self Care Barriers to Discharge: No Barriers Identified   Patient Goals and CMS Choice        Expected Discharge Plan and Services Expected Discharge Plan: Home/Self Care In-house Referral: Clinical Social Work Discharge Planning Services: CM Consult, [facility], Medication Assistance     Expected Discharge Date: 08/15/19                                    Prior Living Arrangements/Services   Lives with:: Roommate Patient language and need for interpreter reviewed:: Yes Do you feel safe going back to the place where you live?: Yes      Need for Family Participation in Patient Care: No (Comment)     Criminal Activity/Legal Involvement Pertinent to Current Situation/Hospitalization: No - Comment as needed  Activities of Daily Living Home Assistive Devices/Equipment: None ADL Screening  (condition at time of admission) Patient's cognitive ability adequate to safely complete daily activities?: Yes Is the patient deaf or have difficulty hearing?: No Does the patient have difficulty seeing, even when wearing glasses/contacts?: No Does the patient have difficulty concentrating, remembering, or making decisions?: No Patient able to express need for assistance with ADLs?: Yes Does the patient have difficulty dressing or bathing?: No Independently performs ADLs?: Yes (appropriate for developmental age) Does the patient have difficulty walking or climbing stairs?: No Weakness of Legs: None Weakness of Arms/Hands: None  Permission Sought/Granted Permission sought to share information with : Case Manager Permission granted to share information with : Yes, Verbal Permission Granted              Emotional Assessment Appearance:: Appears stated age Attitude/Demeanor/Rapport: Engaged Affect (typically observed): Accepting Orientation: : Oriented to Self, Oriented to Place, Oriented to  Time, Oriented to Situation Alcohol / Substance Use: Alcohol Use, Other (comment)(previous history of SA) Psych Involvement: No (comment)  Admission diagnosis:  Cellulitis of chest wall [L03.313] Abscess [L02.91] Breast abscess [N61.1] Left breast abscess [N61.1] Marijuana use [F12.90] Patient Active Problem List   Diagnosis Date Noted  . Anxiety 08/14/2019  . Lactic acidosis 08/14/2019  . Abscess of left breast 08/13/2019  . Excessive drinking of alcohol 08/13/2019  . History of CVA (cerebrovascular accident) 08/13/2019  . Left breast abscess 08/13/2019  .  Hypokalemia   . Acute encephalopathy   . Foot pain, right   . Polysubstance abuse ([place]) 12/13/2018  . Substance induced mood disorder ([place])   . Altered mental status 12/12/2018  . History of heroin abuse ([place]) 12/08/2018  . Depression, recurrent ([place]) 12/08/2018  . Bipolar disorder ([place]) 12/08/2018  . Cerebrovascular accident  (CVA) ([place]) 12/08/2018  . Elevated blood pressure reading 12/08/2018  . Type 2 diabetes mellitus without complication, without long-term current use of insulin ([place]) 12/08/2018  . Hepatitis C   . Seizures ([place])   . Opioid use disorder, severe, dependence ([place]) 08/04/2016  . TOBACCO USER 03/03/2009  . ASTHMA, INTERMITTENT 07/23/2006   PCP:  Lesleigh Noe, MD Pharmacy:   [facility], [address] [place] [address] Phone: [phone] Fax: [phone]  [facility], [place] [address] Phone: [phone] Fax: [phone]  [id number] [place], [address] [place] [address] Phone: [phone] Fax: [phone]     Social Determinants of Health (SDOH) Interventions    Readmission Risk Interventions No flowsheet data found.

## 2019-08-18 LAB — CULTURE, BLOOD (ROUTINE X 2)
Culture: NO GROWTH
Culture: NO GROWTH
Special Requests: ADEQUATE

## 2019-08-22 ENCOUNTER — Inpatient Hospital Stay: Payer: Self-pay | Admitting: Family Medicine

## 2019-08-22 NOTE — Consult Note (Signed)
Subjective:   CC: Breast abscess  HPI:  Haley Olsen is a 29 y.o. female who was consulted by monks for evaluation of above.  First noted a few days ago.  Symptoms include: Pain is sharp, worsening.  Exacerbated by touch.  Alleviated by nothing specific.  Associated with redness and swelling.     Past Medical History:  has a past medical history of Anxiety, Arthritis, Asthma, Chlamydia, Depression, Diabetes mellitus without complication ([place]), Drug addiction ([place]), GERD (gastroesophageal reflux disease), Hepatitis C, History of syphilis, Migraine, Seizures ([place]), and Stroke ([place]).  Past Surgical History:  has a past surgical history that includes Nose surgery.  Family History: family history includes Alcohol abuse in her father and paternal grandmother; Arthritis in her father; Asthma in her maternal grandmother; Depression in her father, maternal grandmother, mother, and paternal grandmother; Diabetes in her maternal grandfather and maternal grandmother; Drug abuse in her father, maternal grandfather, maternal grandmother, and mother; Hearing loss in her father; Heart attack in her maternal grandfather; Heart attack (age of onset: 56) in her maternal grandmother and paternal grandmother; Heart disease in her maternal grandfather and maternal grandmother; Hyperlipidemia in her maternal grandmother.  Social History:  reports that she has been smoking cigarettes. She has a 3.00 pack-year smoking history. She has never used smokeless tobacco. She reports current alcohol use. She reports previous drug use. Drugs: IV, Marijuana, and Heroin.  Current Medications: (Not in a hospital admission)   Allergies:  Allergies  Allergen Reactions  . Wasp Venom Protein Anaphylaxis  . Hydrocodone Hives and Itching    ROS:  General: Denies weight loss, weight gain, fatigue, fevers, chills, and night sweats. Eyes: Denies blurry vision, double vision, eye pain, itchy eyes, and tearing. Ears: Denies  hearing loss, earache, and ringing in ears. Nose: Denies sinus pain, congestion, infections, runny nose, and nosebleeds. Mouth/throat: Denies hoarseness, sore throat, bleeding gums, and difficulty swallowing. Heart: Denies chest pain, palpitations, racing heart, irregular heartbeat, leg pain or swelling, and decreased activity tolerance. Respiratory: Denies breathing difficulty, shortness of breath, wheezing, cough, and sputum. GI: Denies change in appetite, heartburn, nausea, vomiting, constipation, diarrhea, and blood in stool. GU: Denies difficulty urinating, pain with urinating, urgency, frequency, blood in urine. Musculoskeletal: Denies joint stiffness, pain, swelling, muscle weakness. Skin: Denies rash, itching, mass, tumors, sores, and boils Neurologic: Denies headache, fainting, dizziness, seizures, numbness, and tingling. Psychiatric: Denies depression, anxiety, difficulty sleeping, and memory loss. Endocrine: Denies heat or cold intolerance, and increased thirst or urination. Blood/lymph: Denies easy bruising, easy bruising, and swollen glands     Objective:     BP (!) 126/91 (BP Location: Left Arm)   Pulse (!) 103   Temp 98.2 F (36.8 C) (Oral)   Resp 16   Ht 5\' 3"  (1.6 m)   Wt 58 kg   SpO2 99%   BMI 22.65 kg/m   Constitutional :  alert, cooperative, appears stated age and no distress  Lymphatics/Throat:  no asymmetry, masses, or scars  Respiratory:  clear to auscultation bilaterally  Cardiovascular:  regular rate and rhythm  Gastrointestinal: soft, non-tender; bowel sounds normal; no masses,  no organomegaly.  Musculoskeletal: Steady gait and movement  Skin: Cool and moist,   Psychiatric: Normal affect, non-agitated, not confused  Breast:  Chaperone present for exam.  Left breast with obvious induration and erythema surrounding the nipple areolar complex but no obvious discharge.  Tenderness to palpation consistent with breast abscess.    LABS:  CMP Latest Ref Rng  & Units  08/15/2019 08/14/2019 08/13/2019  Glucose 70 - 99 mg/dL 109(H) 102(H) 114(H)  BUN 6 - 20 mg/dL 15 19 10   Creatinine 0.44 - 1.00 mg/dL 0.61 0.58 0.67  Sodium 135 - 145 mmol/L 135 140 140  Potassium 3.5 - 5.1 mmol/L 4.0 4.3 3.6  Chloride 98 - 111 mmol/L 104 105 104  CO2 22 - 32 mmol/L 23 24 27   Calcium 8.9 - 10.3 mg/dL 8.8(L) 9.1 8.7(L)  Total Protein 6.5 - 8.1 g/dL - - 7.0  Total Bilirubin 0.3 - 1.2 mg/dL - - 0.6  Alkaline Phos 38 - 126 U/L - - 84  AST 15 - 41 U/L - - 44(H)  ALT 0 - 44 U/L - - 35   CBC Latest Ref Rng & Units 08/15/2019 08/14/2019 08/13/2019  WBC 4.0 - 10.5 K/uL 6.9 6.7 8.9  Hemoglobin 12.0 - 15.0 g/dL 13.8 13.6 13.3  Hematocrit 36.0 - 46.0 % 43.0 42.2 41.3  Platelets 150 - 400 K/uL 292 288 321    RADS: n/a  Assessment:     Left breast abscess   Plan:     1. Alternatives include continued observation with antibiotics.  Benefits of I&D include possible symptom relief, pathologic evaluation, improved cosmesis. Discussed the risk of surgery including recurrence, chronic pain, post-op infxn, poor cosmesis, poor/delayed wound healing, and possible re-operation to address said risks. The risks of general anesthetic, if used, includes MI, CVA, sudden death or even reaction to anesthetic medications also discussed.  Typical post-op recovery time of 3-5 days with possible activity restrictions were also discussed.  The patient verbalized understanding and all questions were answered to the patient's satisfaction.  Due to work obligations patient requested that we defer any treatment today and continue with antibiotics until she can come back at a later date.  We again reviewed the risk benefits and alternatives as noted above and patient still wishes to proceed with antibiotic management despite my initial recommendations of proceeding with drainage.  Plan discussed with ER provider and all is in agreement.  Follow-up information provided to patient as well as  antibiotics and pain control.

## 2020-08-22 IMAGING — CT CT HEAD WITHOUT CONTRAST
3 series · 15 of 47 positions shown, 18 images · non-contrast
Comparison: None.

CLINICAL DATA: Altered mental status. Previous stroke. Smoker. Drug
abuse.

EXAM:
CT HEAD WITHOUT CONTRAST
TECHNIQUE: Contiguous axial images were obtained from the base of the skull
through the vertex without intravenous contrast.

[Series 3: head wo · axial · 0.47mm/px · z∈[-179,-49]mm · 9 of 32 slices shown, 12 images]
[im 3/32  brain]
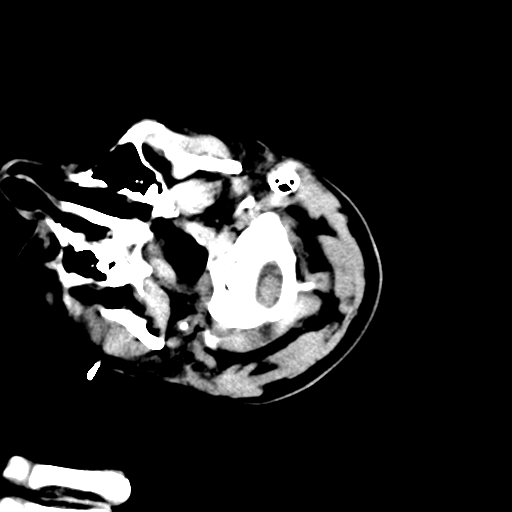
[im 3/32  bone]
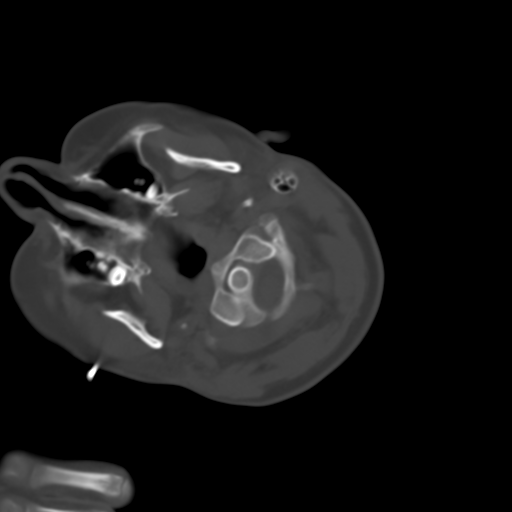
[im 6/32  brain]
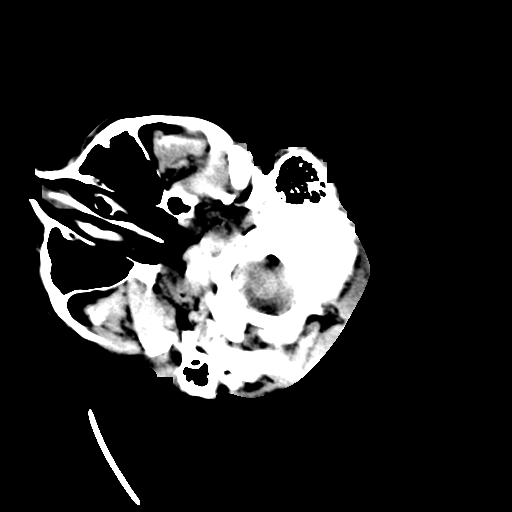
[im 9/32  brain]
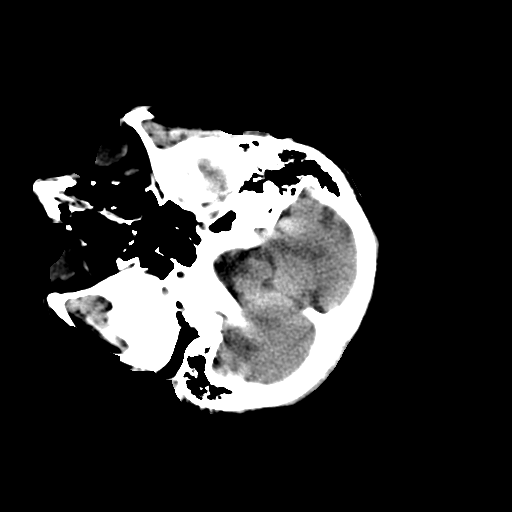
[im 12/32  brain]
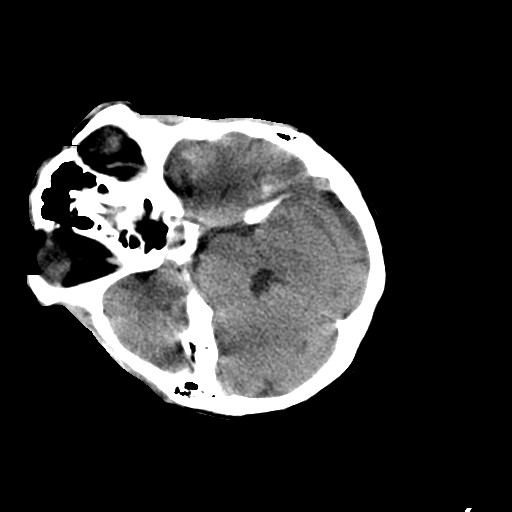
[im 17/32  brain]
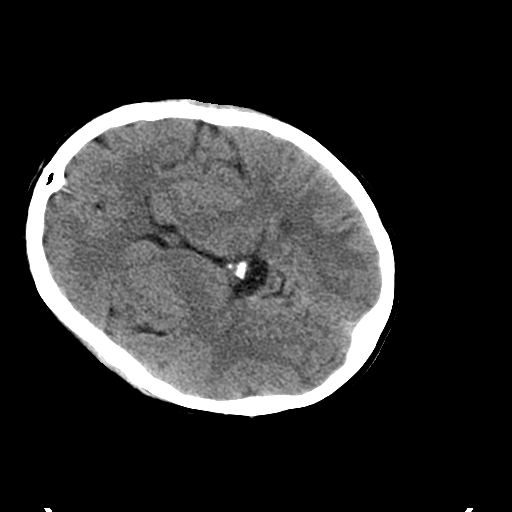
[im 17/32  bone]
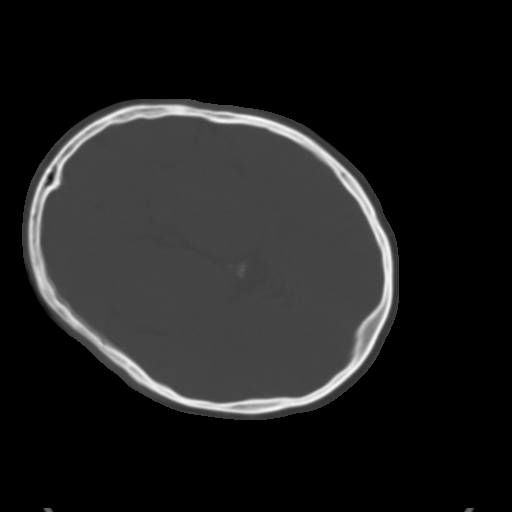
[im 20/32  brain]
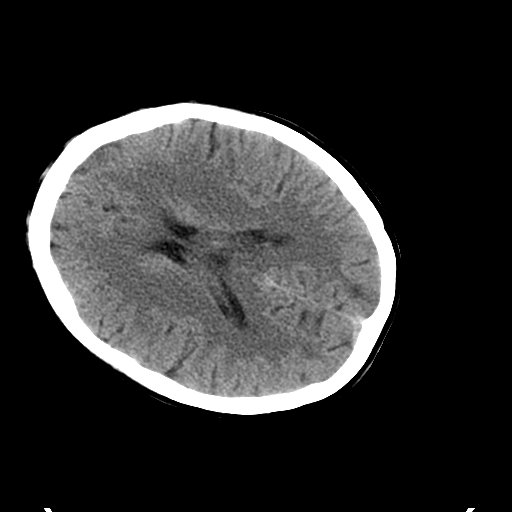
[im 23/32  brain]
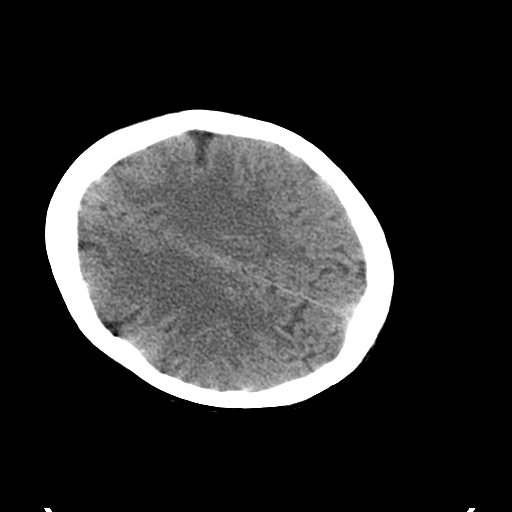
[im 26/32  brain]
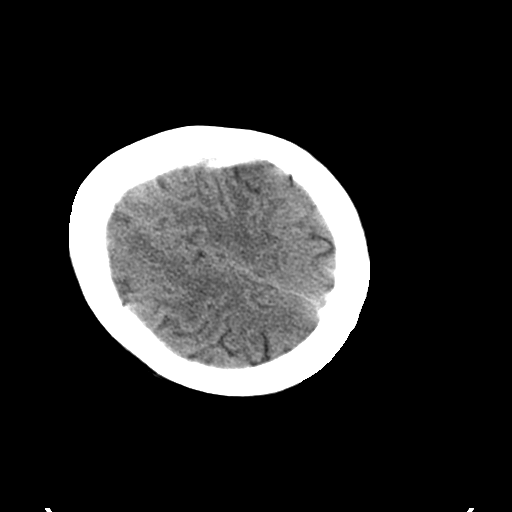
[im 29/32  brain]
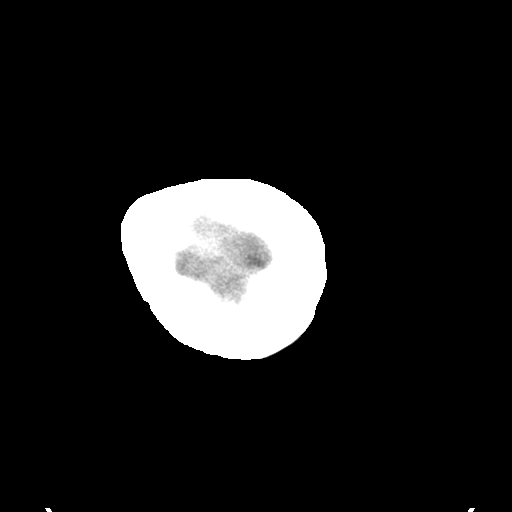
[im 29/32  bone]
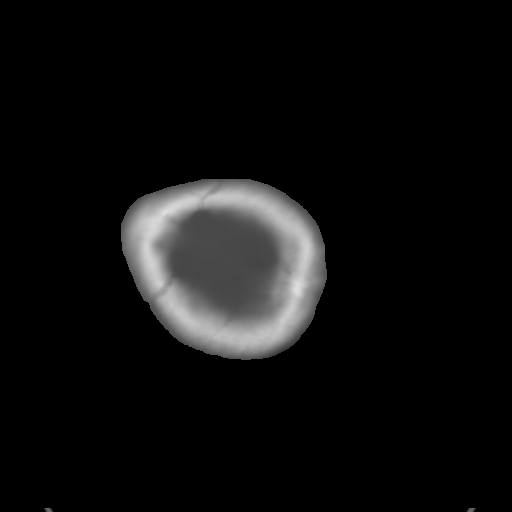

[Series 5: coronal soft tissue · coronal · 0.30mm/px · 3 of 75 slices shown]
[im 25/75  brain]
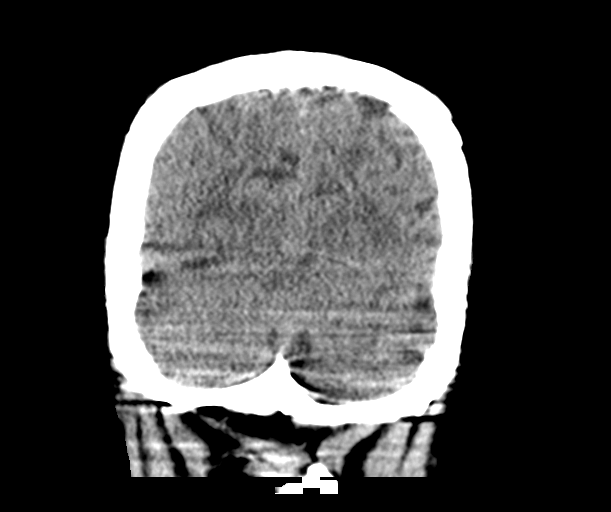
[im 33/75  brain]
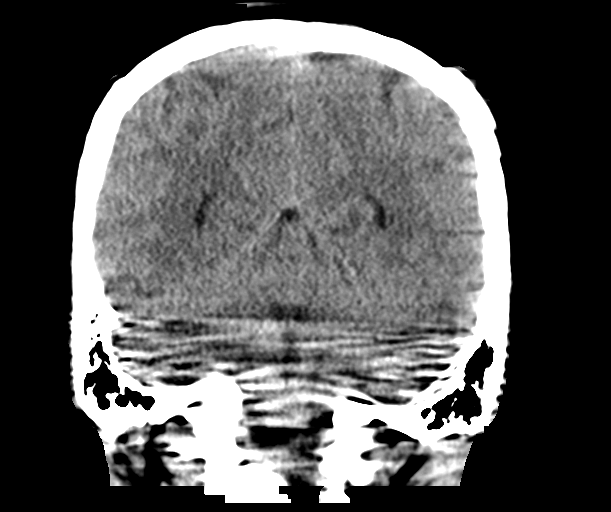
[im 42/75  brain]
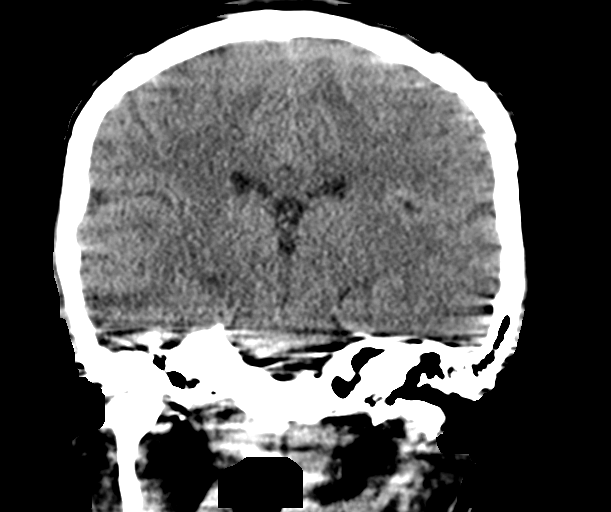

[Series 6: sagittal soft tissue · sagittal · 0.30mm/px · 3 of 52 slices shown]
[im 18/52  brain]
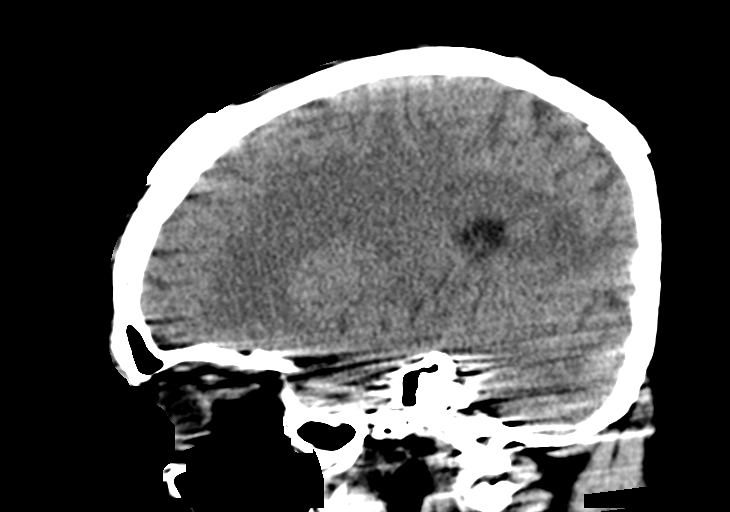
[im 26/52  brain]
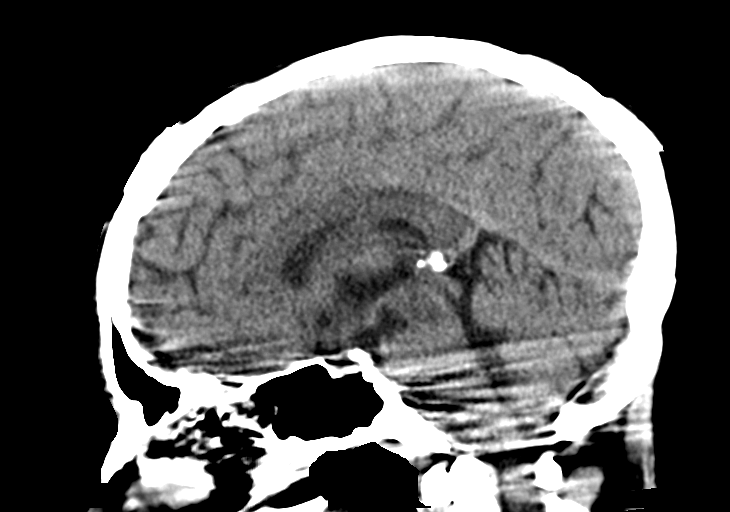
[im 35/52  brain]
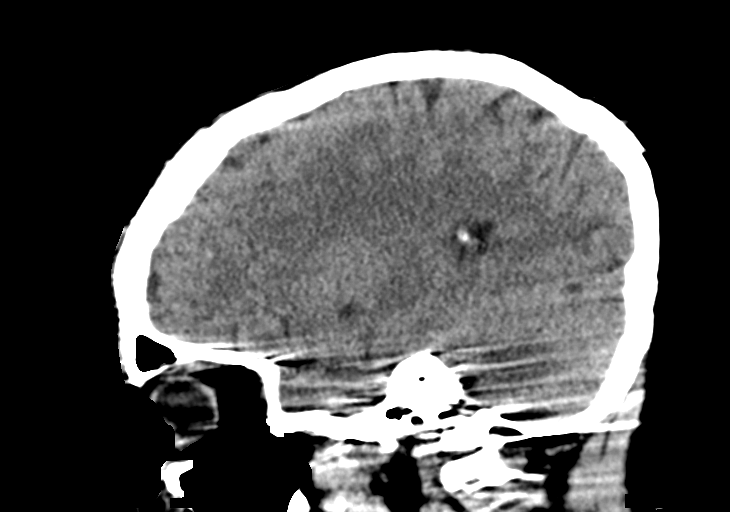

[15 of 47 positions shown; findings below may reference images not displayed]

FINDINGS: Brain: The examination is limited by artifacts produced by patient
motion. The visualized cerebral hemispheres and posterior fossa
structures have normal appearances. The ventricles are normal in
size and position. No intracranial hemorrhage, mass lesion or CT
evidence of acute infarction.

Vascular: No hyperdense vessel or unexpected calcification.

Skull: Normal. Negative for fracture or focal lesion.

Sinuses/Orbits: No acute finding.

Other: None.
IMPRESSION: Limited examination due to patient motion. No visible abnormality.
